# Patient Record
Sex: Female | Born: 1995 | Race: Black or African American | Hispanic: No | Marital: Single | Smoking: Former smoker
Health system: Southern US, Community
[De-identification: ages and names within clinical notes are randomized; demographics above are authoritative.]

## PROBLEM LIST (undated history)

## (undated) DIAGNOSIS — Z889 Allergy status to unspecified drugs, medicaments and biological substances status: Secondary | ICD-10-CM

## (undated) DIAGNOSIS — A549 Gonococcal infection, unspecified: Principal | ICD-10-CM

## (undated) DIAGNOSIS — T7840XA Allergy, unspecified, initial encounter: Secondary | ICD-10-CM

## (undated) DIAGNOSIS — I10 Essential (primary) hypertension: Secondary | ICD-10-CM

## (undated) DIAGNOSIS — J45909 Unspecified asthma, uncomplicated: Secondary | ICD-10-CM

## (undated) HISTORY — DX: Unspecified asthma, uncomplicated: J45.909

## (undated) HISTORY — DX: Gonococcal infection, unspecified: A54.9

## (undated) HISTORY — DX: Allergy status to unspecified drugs, medicaments and biological substances: Z88.9

---
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
## 2014-08-23 ENCOUNTER — Emergency Department (HOSPITAL_COMMUNITY)
Admission: EM | Admit: 2014-08-23 | Discharge: 2014-08-23 | Disposition: A | Discharge status: Home / Self Care | Payer: No Typology Code available for payment source | Attending: Emergency Medicine | Admitting: Emergency Medicine

## 2014-08-23 ENCOUNTER — Encounter (HOSPITAL_COMMUNITY): Payer: Self-pay | Admitting: Emergency Medicine

## 2014-08-23 DIAGNOSIS — S39012A Strain of muscle, fascia and tendon of lower back, initial encounter: Secondary | ICD-10-CM | POA: Insufficient documentation

## 2014-08-23 DIAGNOSIS — Z720 Tobacco use: Secondary | ICD-10-CM | POA: Insufficient documentation

## 2014-08-23 DIAGNOSIS — Y9389 Activity, other specified: Secondary | ICD-10-CM | POA: Diagnosis not present

## 2014-08-23 DIAGNOSIS — Y92410 Unspecified street and highway as the place of occurrence of the external cause: Secondary | ICD-10-CM | POA: Insufficient documentation

## 2014-08-23 DIAGNOSIS — Y998 Other external cause status: Secondary | ICD-10-CM | POA: Diagnosis not present

## 2014-08-23 DIAGNOSIS — Z79899 Other long term (current) drug therapy: Secondary | ICD-10-CM | POA: Insufficient documentation

## 2014-08-23 DIAGNOSIS — S3992XA Unspecified injury of lower back, initial encounter: Secondary | ICD-10-CM | POA: Diagnosis present

## 2014-08-23 DIAGNOSIS — T148XXA Other injury of unspecified body region, initial encounter: Secondary | ICD-10-CM

## 2014-08-23 MED ORDER — ACETAMINOPHEN 325 MG PO TABS
650.0000 mg | ORAL_TABLET | Freq: Once | ORAL | Status: AC
  Administered 2014-08-23: 650 mg via ORAL
  Filled 2014-08-23: qty 2

## 2014-08-23 MED ORDER — IBUPROFEN 600 MG PO TABS: 600.0000 mg | ORAL_TABLET | Freq: Four times a day (QID) | ORAL | Status: DC | PRN

## 2014-08-23 MED ORDER — CYCLOBENZAPRINE HCL 10 MG PO TABS: 10.0000 mg | ORAL_TABLET | Freq: Two times a day (BID) | ORAL | Status: DC | PRN

## 2014-08-23 MED ORDER — KETOROLAC TROMETHAMINE 10 MG PO TABS
10.0000 mg | ORAL_TABLET | Freq: Once | ORAL | Status: AC
  Administered 2014-08-23: 10 mg via ORAL
  Filled 2014-08-23: qty 1

## 2014-08-23 MED ORDER — CYCLOBENZAPRINE HCL 10 MG PO TABS
10.0000 mg | ORAL_TABLET | Freq: Once | ORAL | Status: AC
  Administered 2014-08-23: 10 mg via ORAL
  Filled 2014-08-23: qty 1
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
## 2015-03-23 ENCOUNTER — Emergency Department (HOSPITAL_COMMUNITY)
Admission: EM | Admit: 2015-03-23 | Discharge: 2015-03-23 | Disposition: A | Discharge status: Home / Self Care | Payer: Medicaid Other | Attending: Emergency Medicine | Admitting: Emergency Medicine

## 2015-03-23 ENCOUNTER — Emergency Department (HOSPITAL_COMMUNITY): Payer: Medicaid Other

## 2015-03-23 ENCOUNTER — Encounter (HOSPITAL_COMMUNITY): Payer: Self-pay

## 2015-03-23 DIAGNOSIS — X58XXXA Exposure to other specified factors, initial encounter: Secondary | ICD-10-CM | POA: Diagnosis not present

## 2015-03-23 DIAGNOSIS — S9031XA Contusion of right foot, initial encounter: Secondary | ICD-10-CM | POA: Insufficient documentation

## 2015-03-23 DIAGNOSIS — Y9289 Other specified places as the place of occurrence of the external cause: Secondary | ICD-10-CM | POA: Diagnosis not present

## 2015-03-23 DIAGNOSIS — Y998 Other external cause status: Secondary | ICD-10-CM | POA: Diagnosis not present

## 2015-03-23 DIAGNOSIS — L0231 Cutaneous abscess of buttock: Secondary | ICD-10-CM

## 2015-03-23 DIAGNOSIS — Y9389 Activity, other specified: Secondary | ICD-10-CM | POA: Insufficient documentation

## 2015-03-23 DIAGNOSIS — Z720 Tobacco use: Secondary | ICD-10-CM | POA: Insufficient documentation

## 2015-03-23 MED ORDER — IBUPROFEN 600 MG PO TABS: 600.0000 mg | ORAL_TABLET | Freq: Four times a day (QID) | ORAL | Status: DC | PRN

## 2015-03-23 MED ORDER — SULFAMETHOXAZOLE-TRIMETHOPRIM 800-160 MG PO TABS: 1.0000 | ORAL_TABLET | Freq: Two times a day (BID) | ORAL | Status: AC

## 2015-03-23 MED ORDER — IBUPROFEN 800 MG PO TABS
800.0000 mg | ORAL_TABLET | Freq: Once | ORAL | Status: AC
  Administered 2015-03-23: 800 mg via ORAL
  Filled 2015-03-23: qty 1

## 2015-03-23 MED ORDER — SULFAMETHOXAZOLE-TRIMETHOPRIM 800-160 MG PO TABS
1.0000 | ORAL_TABLET | Freq: Once | ORAL | Status: AC
  Administered 2015-03-23: 1 via ORAL
  Filled 2015-03-23: qty 1

## 2015-03-23 NOTE — ED Notes (Signed)
Pt reports abscess to buttock for past 3 days and knot on r foot.  Pt reports knot on foot has been present for 2 weeks.

## 2015-03-23 NOTE — Discharge Instructions (Signed)
Abscess An abscess is an infected area that contains a collection of pus and debris.It can occur in almost any part of the body. An abscess is also known as a furuncle or boil. CAUSES  An abscess occurs when tissue gets infected. This can occur from blockage of oil or sweat glands, infection of hair follicles, or a minor injury to the skin. As the body tries to fight the infection, pus collects in the area and creates pressure under the skin. This pressure causes pain. People with weakened immune systems have difficulty fighting infections and get certain abscesses more often.  SYMPTOMS Usually an abscess develops on the skin and becomes a painful mass that is red, warm, and tender. If the abscess forms under the skin, you may feel a moveable soft area under the skin. Some abscesses break open (rupture) on their own, but most will continue to get worse without care. The infection can spread deeper into the body and eventually into the bloodstream, causing you to feel ill.  DIAGNOSIS  Your caregiver will take your medical history and perform a physical exam. A sample of fluid may also be taken from the abscess to determine what is causing your infection. TREATMENT  Your caregiver may prescribe antibiotic medicines to fight the infection. However, taking antibiotics alone usually does not cure an abscess. Your caregiver may need to make a small cut (incision) in the abscess to drain the pus. In some cases, gauze is packed into the abscess to reduce pain and to continue draining the area. HOME CARE INSTRUCTIONS   Only take over-the-counter or prescription medicines for pain, discomfort, or fever as directed by your caregiver.  If you were prescribed antibiotics, take them as directed. Finish them even if you start to feel better.  If gauze is used, follow your caregiver's directions for changing the gauze.  To avoid spreading the infection:  Keep your draining abscess covered with a  bandage.  Wash your hands well.  Do not share personal care items, towels, or whirlpools with others.  Avoid skin contact with others.  Keep your skin and clothes clean around the abscess.  Keep all follow-up appointments as directed by your caregiver. SEEK MEDICAL CARE IF:   You have increased pain, swelling, redness, fluid drainage, or bleeding.  You have muscle aches, chills, or a general ill feeling.  You have a fever. MAKE SURE YOU:   Understand these instructions.  Will watch your condition.  Will get help right away if you are not doing well or get worse.   This information is not intended to replace advice given to you by your health care provider. Make sure you discuss any questions you have with your health care provider.   Document Released: 02/22/2005 Document Revised: 11/14/2011 Document Reviewed: 07/28/2011 Elsevier Interactive Patient Education 2016 [place] A contusion is a deep bruise. Contusions are the result of a blunt injury to tissues and muscle fibers under the skin. The injury causes bleeding under the skin. The skin overlying the contusion may turn blue, purple, or yellow. Minor injuries will give you a painless contusion, but more severe contusions may stay painful and swollen for a few weeks.  CAUSES  This condition is usually caused by a blow, trauma, or direct force to an area of the body. SYMPTOMS  Symptoms of this condition include:  Swelling of the injured area.  Pain and tenderness in the injured area.  Discoloration. The area may have redness and then turn  blue, purple, or yellow. DIAGNOSIS  This condition is diagnosed based on a physical exam and medical history. An X-ray, CT scan, or MRI may be needed to determine if there are any associated injuries, such as broken bones (fractures). TREATMENT  Specific treatment for this condition depends on what area of the body was injured. In general, the best treatment for a  contusion is resting, icing, applying pressure to (compression), and elevating the injured area. This is often called the RICE strategy. Over-the-counter anti-inflammatory medicines may also be recommended for pain control.  HOME CARE INSTRUCTIONS   Rest the injured area.  If directed, apply ice to the injured area:  Put ice in a plastic bag.  Place a towel between your skin and the bag.  Leave the ice on for 20 minutes, 2-3 times per day.  If directed, apply light compression to the injured area using an elastic bandage. Make sure the bandage is not wrapped too tightly. Remove and reapply the bandage as directed by your health care provider.  If possible, raise (elevate) the injured area above the level of your heart while you are sitting or lying down.  Take over-the-counter and prescription medicines only as told by your health care provider. SEEK MEDICAL CARE IF:  Your symptoms do not improve after several days of treatment.  Your symptoms get worse.  You have difficulty moving the injured area. SEEK IMMEDIATE MEDICAL CARE IF:   You have severe pain.  You have numbness in a hand or foot.  Your hand or foot turns pale or cold.   This information is not intended to replace advice given to you by your health care provider. Make sure you discuss any questions you have with your health care provider.   Document Released: 02/22/2005 Document Revised: 02/03/2015 Document Reviewed: 09/30/2014 Elsevier Interactive Patient Education [employer].

## 2015-03-25 NOTE — ED Provider Notes (Signed)
CSN: [id number]     Arrival date & time 03/23/15  1715 History   First MD Initiated Contact with Patient 03/23/15 1903     Chief Complaint  Patient presents with  . Abscess     (Consider location/radiation/quality/duration/timing/severity/associated sxs/prior Treatment) The history is provided by the patient.   Christy Goodman is a 19 y.o. female with 2 complaints, first, has an abscess on her right upper buttock present for the past several days.  She has been using warm soaks and endorses that it drained last night and is now smaller but still painful.  She denies history of abscess.  She has not had fevers, chills or rash.  She also notes a swollen tender right lateral foot present for approximately 2 weeks. She has no known trauma to the site.  She works in Scientist, research (medical), on her feet a lot.  Pain is worsened with weight bearing and when she flexes her toes. She had had no medicine or treatment for this condition.     History reviewed. No pertinent past medical history. History reviewed. No pertinent past surgical history. No family history on file. Social History  Substance Use Topics  . Smoking status: Current Some Day Smoker  . Smokeless tobacco: None  . Alcohol Use: Yes     Comment: occ   OB History    No data available     Review of Systems  Constitutional: Negative for fever.  Musculoskeletal: Positive for joint swelling and arthralgias. Negative for myalgias.  Skin:       Negative except as mentioned in HPI.    Neurological: Negative for weakness and numbness.      Allergies  Review of patient's allergies indicates no known allergies.  Home Medications   Prior to Admission medications   Medication Sig Start Date End Date Taking? Authorizing Provider  ibuprofen (ADVIL,MOTRIN) 600 MG tablet Take 1 tablet (600 mg total) by mouth every 6 (six) hours as needed for moderate pain. 03/23/15   Evalee Jefferson, PA-C  sulfamethoxazole-trimethoprim (BACTRIM DS,SEPTRA DS)  800-160 MG tablet Take 1 tablet by mouth 2 (two) times daily. 03/23/15 03/30/15  Evalee Jefferson, PA-C   BP 119/72 mmHg  Pulse 77  Temp(Src) 98.1 F (36.7 C) (Oral)  Resp 16  Ht 5' 2.5" (1.588 m)  Wt 193 lb (87.544 kg)  BMI 34.72 kg/m2  SpO2 100%  LMP 03/20/2015 Physical Exam  Constitutional: She appears well-developed and well-nourished.  HENT:  Head: Atraumatic.  Neck: Normal range of motion.  Cardiovascular:  Pulses equal bilaterally  Musculoskeletal: She exhibits tenderness.       Feet:  Subtle edema and ecchymosis right lateral dorsal midfoot.  Ttp. Pain worsens with toe extension.  Distal sensation intact.  Dorsalis pedal pulse normal. No erythema or red streaking, skin intact.  Neurological: She is alert. She has normal strength. She displays normal reflexes. No sensory deficit.  Skin: Skin is warm and dry.  Small area of induration (1 cm) right upper buttock near midline, draining.  No surrounding erythema.  Psychiatric: She has a normal mood and affect.    ED Course  Procedures (including critical care time) Labs Review Labs Reviewed - No data to display  Imaging Review Dg Foot Complete Right  03/23/2015  CLINICAL DATA:  19 year old female with pain and swelling in the lateral aspect of the right foot for the past several days. Right foot numbness. EXAM: RIGHT FOOT COMPLETE - 3+ VIEW COMPARISON:  No priors. FINDINGS: Multiple views of the right  foot demonstrate no acute displaced fracture, subluxation, dislocation, or soft tissue abnormality. IMPRESSION: No acute radiographic abnormality of the right foot. Electronically Signed   By: Vinnie Langton M.D.   On: 03/23/2015 20:03   I have personally reviewed and evaluated these images and lab results as part of my medical decision-making.   EKG Interpretation None      MDM   Final diagnoses:  Abscess of buttock, left  Foot contusion, right, initial encounter     Radiological studies were viewed, interpreted and  considered during the medical decision making and disposition process. I agree with radiologists reading.  Results were also discussed with patient. Pt with draining small abscess.  Advised warm soaks, added bactrim.  Ibuprofen for pain and foot discomfort.  Suspect Pt may have contused foot without being aware of event.  Exam benign.  Advised f/u with pcp if sx persist or worsen.     Evalee Jefferson, PA-C 03/25/15 1151  Ripley Fraise, MD 03/25/15 [phone]

## 2015-04-07 ENCOUNTER — Encounter: Payer: Medicaid Other | Admitting: Obstetrics and Gynecology

## 2015-04-12 ENCOUNTER — Ambulatory Visit (INDEPENDENT_AMBULATORY_CARE_PROVIDER_SITE_OTHER): Payer: Medicaid Other | Admitting: Obstetrics and Gynecology

## 2015-04-12 ENCOUNTER — Encounter: Payer: Self-pay | Admitting: Obstetrics and Gynecology

## 2015-04-12 VITALS — BP 122/80 | Ht 62.5 in | Wt 198.5 lb

## 2015-04-12 DIAGNOSIS — A6009 Herpesviral infection of other urogenital tract: Secondary | ICD-10-CM | POA: Insufficient documentation

## 2015-04-12 DIAGNOSIS — A609 Anogenital herpesviral infection, unspecified: Secondary | ICD-10-CM

## 2015-04-12 MED ORDER — ACYCLOVIR 400 MG PO TABS: 400.0000 mg | ORAL_TABLET | Freq: Three times a day (TID) | ORAL | Status: DC

## 2015-04-12 NOTE — Progress Notes (Signed)
Patient ID: Christy Goodman, female   DOB: 03/21/1996, 19 y.o.   MRN: [id number] Pt here today for follow up from ER. Pt states that she has something growing on her butt. Pt states that she has had it for about 3 weeks.

## 2015-04-12 NOTE — Progress Notes (Signed)
Patient ID: Tiffanee Tsai Sou, female   DOB: May 22, 1996, 19 y.o.   MRN: [id number]    [facility] Visit  Patient name: Tayleigh Passwater Grigoryan MRN [id number]  Date of birth: 1995/06/22  CC & HPI:  Faina Bowlby Stavola is a 19 y.o. female presenting today for gradually improving, itching lesions on her left buttocks that initially appeared 3 weeks ago. She tried applying A&B ointment and warm soaks with no relief. Pt was seen in the ED on 10/25, was prescribed Bactrim and was diagnosed with an abscess on her buttocks. She did not take the Bactrim because she did not trust the ED provider. Pt denies fever, drainage and lesions in her vaginal area.  ROS:  A complete 10 system review of systems was obtained and all systems are negative except as noted in the HPI and PMH.   Pertinent History Reviewed:   Reviewed. Medical        History reviewed. No pertinent past medical history.                            Surgical Hx:   History reviewed. No pertinent past surgical history. Medications: Reviewed & Updated - see associated section                      No current outpatient prescriptions on file.   Social History: Reviewed -  reports that she has been smoking Cigarettes.  She has a .25 pack-year smoking history. She has never used smokeless tobacco.  Objective Findings:  Vitals: Blood pressure 122/80, height 5' 2.5" (1.588 m), weight 198 lb 8 oz (90.039 kg), last menstrual period 03/20/2015.  Physical Examination: General appearance - alert, well appearing, and in no distress, oriented to person, place, and time and overweight Mental status - alert, oriented to person, place, and time, normal mood, behavior, speech, dress, motor activity, and thought processes Skin - 4 discrete areas of very distinct, desquamated areas on cheek of left buttocks  Assessment & Plan:   A:  1. 4 discrete areas of discrete, desquamated area on left buttocks up to 5 cm diameter.   P:  1. Pt to continue A&B  ointment 2. Pt return in 2 weeks for re-check 3. Will prescribe acyclovir 400 tid x 7d   By signing my name below, I, Tula Nakayama, attest that this documentation has been prepared under the direction and in the presence of Jonnie Kind, MD. Electronically Signed: Tula Nakayama, ED Scribe. 04/12/2015. 12:06 PM.  I personally performed the services described in this documentation, which was SCRIBED in my presence. The recorded information has been reviewed and considered accurate. It has been edited as necessary during review. Jonnie Kind, MD

## 2015-04-26 ENCOUNTER — Ambulatory Visit (INDEPENDENT_AMBULATORY_CARE_PROVIDER_SITE_OTHER): Payer: Medicaid Other | Admitting: Obstetrics and Gynecology

## 2015-04-26 ENCOUNTER — Encounter: Payer: Self-pay | Admitting: Obstetrics and Gynecology

## 2015-04-26 VITALS — BP 120/80 | Ht 62.5 in

## 2015-04-26 DIAGNOSIS — L98419 Non-pressure chronic ulcer of buttock with unspecified severity: Secondary | ICD-10-CM | POA: Diagnosis not present

## 2015-04-26 NOTE — Progress Notes (Signed)
   [facility] Visit  Patient name: Christy Goodman MRN [id number]  Date of birth: 12-17-1995 CC & HPI:  Christy Goodman is a 19 y.o. female presenting today for a follow-up of lesions on her buttocks. Per chart review, patient was seen 14 days ago and treated with acyclovir 400 tid x 7d.  ROS:  A complete review of systems was obtained and all systems are negative except as noted in the HPI and PMH.    Pertinent History Reviewed:   Reviewed: Significant for n/a Medical        History reviewed. No pertinent past medical history.                            Surgical Hx:   History reviewed. No pertinent past surgical history. Medications: Reviewed & Updated - see associated section                      No current outpatient prescriptions on file.   Social History: Reviewed -  reports that she has been smoking Cigarettes.  She has a .25 pack-year smoking history. She has never used smokeless tobacco.  Objective Findings:  Vitals: Blood pressure 120/80, height 5' 2.5" (1.588 m).  Physical Examination: General appearance - alert, well appearing, and in no distress, oriented to person, place, and time and overweight Mental status - alert, oriented to person, place, and time, normal mood, behavior, speech, dress, motor activity, and thought processes, affect appropriate to mood Skin - LESIONS NOTED: 4 discrete areas of very distinct, well-healed, hyperpigmented lesions on cheek of left buttock  Assessment & Plan:   A:  1. Resolved gluteal skin lesions. It is unclear if this represented herpetic lesion  2. Hyperpigmentation at the site of previous ulcerative lesions, stable  P:  1. Pt advised to return if her skin lesions recur for herpes culture 2. F/u prn.   By signing my name below, I, Stephania Fragmin, attest that this documentation has been prepared under the direction and in the presence of Jonnie Kind, MD. Electronically Signed: Stephania Fragmin, ED Scribe. 04/26/2015. 10:00  AM.  I personally performed the services described in this documentation, which was SCRIBED in my presence. The recorded information has been reviewed and considered accurate. It has been edited as necessary during review. Jonnie Kind, MD   (scribe attestation statement)

## 2015-04-26 NOTE — Progress Notes (Signed)
Patient ID: Christy Goodman, female   DOB: 09/14/1995, 19 y.o.   MRN: [id number] Pt here today for follow up visit. Pt states that the area she was seen for last time is much better.

## 2015-04-27 DIAGNOSIS — L98419 Non-pressure chronic ulcer of buttock with unspecified severity: Secondary | ICD-10-CM | POA: Insufficient documentation

## 2015-12-22 ENCOUNTER — Ambulatory Visit: Payer: Medicaid Other | Admitting: Obstetrics and Gynecology

## 2015-12-23 ENCOUNTER — Ambulatory Visit: Payer: Self-pay | Admitting: Advanced Practice Midwife

## 2015-12-29 ENCOUNTER — Ambulatory Visit (INDEPENDENT_AMBULATORY_CARE_PROVIDER_SITE_OTHER): Payer: Self-pay | Admitting: Advanced Practice Midwife

## 2015-12-29 ENCOUNTER — Encounter: Payer: Self-pay | Admitting: Advanced Practice Midwife

## 2015-12-29 VITALS — BP 110/80 | HR 100 | Ht 62.5 in | Wt 198.0 lb

## 2015-12-29 DIAGNOSIS — Z3202 Encounter for pregnancy test, result negative: Secondary | ICD-10-CM

## 2015-12-29 DIAGNOSIS — Z30011 Encounter for initial prescription of contraceptive pills: Secondary | ICD-10-CM

## 2015-12-29 LAB — POCT URINE PREGNANCY: PREG TEST UR: NEGATIVE

## 2015-12-29 MED ORDER — LEVONORGEST-ETH ESTRAD 91-DAY 0.15-0.03 MG PO TABS: 1.0000 | ORAL_TABLET | Freq: Every day | ORAL | 4 refills | Status: DC

## 2015-12-29 NOTE — Progress Notes (Signed)
[facility] Visit  Patient name: Christy Goodman MRN [id number]  Date of birth: 24-Nov-1995  CC & HPI:  Christy Goodman is a 20 y.o. African American female presenting today for birth control. She had been on Nexplanon, gained weight.  She doesn't have insurance, hasn't looked into the Health Dept/Family planning Medicaid.  Wants COCs.  Having unprotected sex  Knows she could get pregnant, but "hopes not.".   Pertinent History Reviewed:  Medical & Surgical Hx:   Past Medical History:  Diagnosis Date  . Asthma   . H/O seasonal allergies    History reviewed. No pertinent surgical history. Family History  Problem Relation Age of Onset  . Cancer Paternal Grandmother   . Diabetes Paternal Grandmother   . Diabetes Paternal Aunt     Current Outpatient Prescriptions:  .  levonorgestrel-ethinyl estradiol (SEASONALE,INTROVALE,JOLESSA) 0.15-0.03 MG tablet, Take 1 tablet by mouth daily., Disp: 1 Package, Rfl: 4 Social History: Reviewed -  reports that she has been smoking Cigarettes.  She has a 0.25 pack-year smoking history. She has never used smokeless tobacco.  Review of Systems:   Constitutional: Negative for fever and chills Eyes: Negative for visual disturbances Respiratory: Negative for shortness of breath, dyspnea Cardiovascular: Negative for chest pain or palpitations  Gastrointestinal: Negative for vomiting, diarrhea and constipation; no abdominal pain Genitourinary: Negative for dysuria and urgency, vaginal irritation or itching Musculoskeletal: Negative for back pain, joint pain, myalgias  Neurological: Negative for dizziness and headaches    Objective Findings:    Physical Examination: General appearance - well appearing, and in no distress Mental status - alert, oriented to person, place, and time Chest:  Normal respiratory effort Heart - normal rate and regular rhythm Musculoskeletal:  Normal range of motion without pain Extremities:  No  edema    Results for orders placed or performed in visit on 12/29/15 (from the past 24 hour(s))  POCT urine pregnancy   Collection Time: 12/29/15  2:52 PM  Result Value Ref Range   Preg Test, Ur Negative Negative      Assessment & Plan:  A:   Samples of [place] (3) given P:  Start with next period.  Condoms encouraged. Also encouraed to look into [facility] medicaid Call me in 3 months and let me know how things are going, sooner if needed   Return if symptoms worsen or fail to improve.  CRESENZO-DISHMAN,Ilyse Tremain CNM 12/29/2015 4:45 PM

## 2016-01-11 ENCOUNTER — Telehealth: Payer: Self-pay | Admitting: Advanced Practice Midwife

## 2016-01-11 NOTE — Telephone Encounter (Signed)
Pt was calling to see if we had samples of her BCP, informed pt we do not keep samples of them here.  Offered to see is Manus Gunning could RX something a little cheaper but pt states her Medicaid has been canceled and she is self pay for everything right now, she is trying to get it fixed but until then she cant afford anything.  Advised pt to make sure and use condoms until she can get on her birthcontrol.

## 2016-02-13 ENCOUNTER — Emergency Department (HOSPITAL_COMMUNITY): Payer: Self-pay

## 2016-02-13 ENCOUNTER — Encounter (HOSPITAL_COMMUNITY): Payer: Self-pay | Admitting: Emergency Medicine

## 2016-02-13 ENCOUNTER — Emergency Department (HOSPITAL_COMMUNITY)
Admission: EM | Admit: 2016-02-13 | Discharge: 2016-02-13 | Disposition: A | Discharge status: Home / Self Care | Payer: Self-pay | Attending: Emergency Medicine | Admitting: Emergency Medicine

## 2016-02-13 DIAGNOSIS — S61219A Laceration without foreign body of unspecified finger without damage to nail, initial encounter: Secondary | ICD-10-CM

## 2016-02-13 DIAGNOSIS — F17210 Nicotine dependence, cigarettes, uncomplicated: Secondary | ICD-10-CM | POA: Insufficient documentation

## 2016-02-13 DIAGNOSIS — J45909 Unspecified asthma, uncomplicated: Secondary | ICD-10-CM | POA: Insufficient documentation

## 2016-02-13 DIAGNOSIS — Z23 Encounter for immunization: Secondary | ICD-10-CM | POA: Insufficient documentation

## 2016-02-13 DIAGNOSIS — Y929 Unspecified place or not applicable: Secondary | ICD-10-CM | POA: Insufficient documentation

## 2016-02-13 DIAGNOSIS — S61214A Laceration without foreign body of right ring finger without damage to nail, initial encounter: Secondary | ICD-10-CM | POA: Insufficient documentation

## 2016-02-13 DIAGNOSIS — Y939 Activity, unspecified: Secondary | ICD-10-CM | POA: Insufficient documentation

## 2016-02-13 DIAGNOSIS — S61216A Laceration without foreign body of right little finger without damage to nail, initial encounter: Secondary | ICD-10-CM | POA: Insufficient documentation

## 2016-02-13 DIAGNOSIS — W268XXA Contact with other sharp object(s), not elsewhere classified, initial encounter: Secondary | ICD-10-CM | POA: Insufficient documentation

## 2016-02-13 DIAGNOSIS — Y999 Unspecified external cause status: Secondary | ICD-10-CM | POA: Insufficient documentation

## 2016-02-13 DIAGNOSIS — S61411A Laceration without foreign body of right hand, initial encounter: Secondary | ICD-10-CM | POA: Insufficient documentation

## 2016-02-13 DIAGNOSIS — S61210A Laceration without foreign body of right index finger without damage to nail, initial encounter: Secondary | ICD-10-CM | POA: Insufficient documentation

## 2016-02-13 DIAGNOSIS — Z79899 Other long term (current) drug therapy: Secondary | ICD-10-CM | POA: Insufficient documentation

## 2016-02-13 MED ORDER — LIDOCAINE-EPINEPHRINE-TETRACAINE (LET) SOLUTION
3.0000 mL | Freq: Once | NASAL | Status: AC
  Administered 2016-02-13: 3 mL via TOPICAL
  Filled 2016-02-13: qty 3

## 2016-02-13 MED ORDER — LIDOCAINE HCL (PF) 2 % IJ SOLN
10.0000 mL | Freq: Once | INTRAMUSCULAR | Status: AC
  Administered 2016-02-13: 10 mL
  Filled 2016-02-13: qty 10

## 2016-02-13 MED ORDER — IBUPROFEN 800 MG PO TABS: 800.0000 mg | ORAL_TABLET | Freq: Three times a day (TID) | ORAL | 0 refills | Status: DC | PRN

## 2016-02-13 MED ORDER — TETANUS-DIPHTH-ACELL PERTUSSIS 5-2.5-18.5 LF-MCG/0.5 IM SUSP
0.5000 mL | Freq: Once | INTRAMUSCULAR | Status: AC
  Administered 2016-02-13: 0.5 mL via INTRAMUSCULAR
  Filled 2016-02-13: qty 0.5

## 2016-02-13 MED ORDER — CEPHALEXIN 500 MG PO CAPS: 500.0000 mg | ORAL_CAPSULE | Freq: Four times a day (QID) | ORAL | 0 refills | Status: DC

## 2016-02-13 NOTE — ED Notes (Signed)
Pt made aware to return if symptoms worsen or if any life threatening symptoms occur.   

## 2016-02-13 NOTE — Discharge Instructions (Signed)
Have your sutures removed in 10 days.  Keep your wound clean and dry,  Until a good scab forms - you may then wash gently twice daily with mild soap and water, but dry completely after.  Get rechecked for any sign of infection (redness,  Swelling,  Increased pain or drainage of purulent fluid). ° °

## 2016-02-13 NOTE — ED Provider Notes (Signed)
[facility] DEPT Provider Note   CSN: [id number] Arrival date & time: 02/13/16  0739     History   Chief Complaint Chief Complaint  Patient presents with  . Laceration    HPI Christy Goodman is a 20 y.o. right handed female presenting with multiple lacerations to her right hand after tripping and landing with [place] type injury, unfortunately landing in broken glass.  She denies hand pain except for localized pain at the site of her wounds. She has applied pressure and dressings but has had no other treatment prior to arrival.  Her tetanus status is unknown.  She denies other injury or pain.  The history is provided by the patient.    Past Medical History:  Diagnosis Date  . Asthma   . H/O seasonal allergies     Patient Active Problem List   Diagnosis Date Noted  . Skin ulcer of buttock ([place]), resolved with  hyperpigmentation 04/27/2015  . Herpes genitalis in women 04/12/2015  . Sore throat 10/02/2013  . Sinusitis, acute 10/02/2013    History reviewed. No pertinent surgical history.  OB History    No data available       Home Medications    Prior to Admission medications   Medication Sig Start Date End Date Taking? Authorizing Provider  levonorgestrel-ethinyl estradiol (SEASONALE,INTROVALE,JOLESSA) 0.15-0.03 MG tablet Take 1 tablet by mouth daily. 12/29/15   Christin Fudge, CNM    Family History Family History  Problem Relation Age of Onset  . Cancer Paternal Grandmother   . Diabetes Paternal Grandmother   . Diabetes Paternal Aunt     Social History Social History  Substance Use Topics  . Smoking status: Current Some Day Smoker    Packs/day: 0.25    Years: 1.00    Types: Cigarettes  . Smokeless tobacco: Never Used  . Alcohol use Yes     Comment: occ     Allergies   Review of patient's allergies indicates no known allergies.   Review of Systems Review of Systems  Constitutional: Negative for chills and fever.  Musculoskeletal:  Negative.   Skin: Positive for wound.  Neurological: Negative for weakness and numbness.     Physical Exam Updated Vital Signs BP 136/92 (BP Location: Left Arm)   Pulse 97   Temp 98.6 F (37 C)   Resp 16   Ht 5\' 3"  (1.6 m)   Wt 89.8 kg   LMP 02/10/2016 (Exact Date)   SpO2 96%   BMI 35.07 kg/m   Physical Exam  Constitutional: She is oriented to person, place, and time. She appears well-developed and well-nourished.  HENT:  Head: Normocephalic.  Cardiovascular: Normal rate.   Pulmonary/Chest: Effort normal.  Musculoskeletal: She exhibits tenderness.  Neurological: She is alert and oriented to person, place, and time. No sensory deficit.  Skin: Laceration noted.  Subcutaneous lacerations on right distal index ring and fifth fingers, approximately three-quarter centimeters each wound.  Hemostatic.  The ring finger laceration is edematous.  There is no purulent drainage.  There is a 2 cm laceration on the right volar hand also subcutaneous and hemostatic.  No palpable or visible foreign body.     ED Treatments / Results  Labs (all labs ordered are listed, but only abnormal results are displayed) Labs Reviewed - No data to display  EKG  EKG Interpretation None       Radiology No results found.  Procedures Procedures (including critical care time)  LACERATION REPAIR Performed by: Evalee Jefferson Authorized by: Delmy Holdren,  Shailen Thielen Consent: Verbal consent obtained. Risks and benefits: risks, benefits and alternatives were discussed Consent given by: patient Patient identity confirmed: provided demographic data Prepped and Draped in normal sterile fashion Wound explored  Laceration Location: right hand, Index fourth and fifth fingers, volar hand  Laceration Length: Three-quarter cm each finger, 2 cm hand laceration  No Foreign Bodies seen or palpated  Anesthesia: Digital blocks using lidocaine 2% without epi on the fingers, 1 cc lidocaine each finger.  Topical let was  used on the hand laceration, required touchup with 2% lidocaine 2 cc.    Local anesthetic: Per above Anesthetic total: 5 ml  Irrigation method: syringe Amount of cleaning: Copious   Skin closure: Ethilon for-0  Number of sutures: 12, including 3 sutures in the fifth, for in the ring finger fingers, 2 in her index finger and 3 on the hand laceration.    Technique: Simple interrupted   Patient tolerance: Patient tolerated the procedure well with no immediate complications.   Medications Ordered in ED Medications  lidocaine (XYLOCAINE) 2 % injection 10 mL (not administered)  lidocaine-EPINEPHrine-tetracaine (LET) solution (not administered)     Initial Impression / Assessment and Plan / ED Course  I have reviewed the triage vital signs and the nursing notes.  Pertinent labs & imaging results that were available during my care of the patient were reviewed by me and considered in my medical decision making (see chart for details).  Clinical Course    Wound care instructions given.  Pt advised to have sutures removed in 10 days,  Return here sooner for any signs of infection including redness, swelling, worse pain or drainage of pus.  Tetanus updated.  She was given Keflex as prophylaxis given these wounds are more than 8 hours old.   Final Clinical Impressions(s) / ED Diagnoses   Final diagnoses:  None    New Prescriptions New Prescriptions   No medications on file     Evalee Jefferson, PA-C 02/13/16 Willis, PA-C 02/13/16 [place], DO 02/16/16 1707

## 2016-02-13 NOTE — ED Triage Notes (Addendum)
Pt tripped and fell onto broken glass causing laceration to palm of right hand appx 3cm long. Pt also has appx 1.5cm laceration to distal end of ring finger on right hand with tissue exposed.  Also has superficial lacerations to distal ends of fifth, third, and second fingers.  No bleeding at this time, edges approximated. PT alert and oriented.  PT denies head injury or loc.

## 2016-02-13 NOTE — ED Notes (Signed)
Non stick pad, guaze and ace wrap applied to right hand, bandaids placed on fingers.

## 2016-02-14 ENCOUNTER — Encounter (HOSPITAL_COMMUNITY): Payer: Self-pay | Admitting: Emergency Medicine

## 2016-02-14 ENCOUNTER — Emergency Department (HOSPITAL_COMMUNITY)
Admission: EM | Admit: 2016-02-14 | Discharge: 2016-02-14 | Disposition: A | Discharge status: Home / Self Care | Payer: Medicaid Other | Attending: Emergency Medicine | Admitting: Emergency Medicine

## 2016-02-14 DIAGNOSIS — Z79899 Other long term (current) drug therapy: Secondary | ICD-10-CM | POA: Insufficient documentation

## 2016-02-14 DIAGNOSIS — J45909 Unspecified asthma, uncomplicated: Secondary | ICD-10-CM | POA: Insufficient documentation

## 2016-02-14 DIAGNOSIS — F17210 Nicotine dependence, cigarettes, uncomplicated: Secondary | ICD-10-CM | POA: Insufficient documentation

## 2016-02-14 DIAGNOSIS — Z4801 Encounter for change or removal of surgical wound dressing: Secondary | ICD-10-CM | POA: Insufficient documentation

## 2016-02-14 DIAGNOSIS — Z791 Long term (current) use of non-steroidal anti-inflammatories (NSAID): Secondary | ICD-10-CM | POA: Insufficient documentation

## 2016-02-14 DIAGNOSIS — Z5189 Encounter for other specified aftercare: Secondary | ICD-10-CM

## 2016-02-14 MED ORDER — TRAMADOL HCL 50 MG PO TABS
50.0000 mg | ORAL_TABLET | Freq: Once | ORAL | Status: AC
  Administered 2016-02-14: 50 mg via ORAL
  Filled 2016-02-14: qty 1

## 2016-02-14 MED ORDER — TRAMADOL HCL 50 MG PO TABS: 50.0000 mg | ORAL_TABLET | Freq: Four times a day (QID) | ORAL | 0 refills | Status: DC | PRN

## 2016-02-14 NOTE — ED Provider Notes (Signed)
[facility] DEPT Provider Note   CSN: [id number] Arrival date & time: 02/14/16  1016  By signing my name below, I, Soijett Blue, attest that this documentation has been prepared under the direction and in the presence of Evalee Jefferson, PA-C Electronically Signed: Soijett Blue, ED Scribe. 02/14/16. 11:20 AM.    History   Chief Complaint Chief Complaint  Patient presents with  . Wound Check    HPI  Christy Goodman is a 20 y.o. female who presents to the Emergency Department complaining of wound check onset today. Pt notes that she was seen in the ED yesterday following a [place] injury landing in glass with laceration repair performed. Pt reports that she is in the ED today due to increased pain to her right ring finger. She states that she is having associated symptoms of right ring finger swelling. She states that she has tried Rx abx and OTC 400 mg ibuprofen without using ice or elevation which was recommended at her initial visit.  She denies drainage, fever, chills, and any other symptoms.   The history is provided by the patient. No language interpreter was used.    Past Medical History:  Diagnosis Date  . Asthma   . H/O seasonal allergies     Patient Active Problem List   Diagnosis Date Noted  . Skin ulcer of buttock ([place]), resolved with  hyperpigmentation 04/27/2015  . Herpes genitalis in women 04/12/2015  . Sore throat 10/02/2013  . Sinusitis, acute 10/02/2013    History reviewed. No pertinent surgical history.  OB History    No data available       Home Medications    Prior to Admission medications   Medication Sig Start Date End Date Taking? Authorizing Provider  cephALEXin (KEFLEX) 500 MG capsule Take 1 capsule (500 mg total) by mouth 4 (four) times daily. 02/13/16  Yes Evalee Jefferson, PA-C  ibuprofen (ADVIL,MOTRIN) 800 MG tablet Take 1 tablet (800 mg total) by mouth every 8 (eight) hours as needed for moderate pain. 02/13/16  Yes Evalee Jefferson, PA-C    levonorgestrel-ethinyl estradiol (SEASONALE,INTROVALE,JOLESSA) 0.15-0.03 MG tablet Take 1 tablet by mouth daily. 12/29/15  Yes Christin Fudge, CNM  traMADol (ULTRAM) 50 MG tablet Take 1 tablet (50 mg total) by mouth every 6 (six) hours as needed. 02/14/16   Evalee Jefferson, PA-C    Family History Family History  Problem Relation Age of Onset  . Cancer Paternal Grandmother   . Diabetes Paternal Grandmother   . Diabetes Paternal Aunt     Social History Social History  Substance Use Topics  . Smoking status: Current Some Day Smoker    Packs/day: 0.25    Years: 1.00    Types: Cigarettes  . Smokeless tobacco: Never Used  . Alcohol use Yes     Comment: occ     Allergies   Review of patient's allergies indicates no known allergies.   Review of Systems Review of Systems  Constitutional: Negative for chills and fever.  Musculoskeletal: Positive for arthralgias (right ring finger) and joint swelling (right ring finger).     Physical Exam Updated Vital Signs BP 139/91 (BP Location: Left Arm)   Pulse 93   Temp 97.8 F (36.6 C) (Oral)   Resp 16   Ht 5\' 3"  (1.6 m)   Wt 89.8 kg   LMP 02/10/2016 (Exact Date)   SpO2 100%   BMI 35.07 kg/m   Physical Exam  Constitutional: She is oriented to person, place, and time. She appears well-developed  and well-nourished. No distress.  HENT:  Head: Normocephalic and atraumatic.  Eyes: EOM are normal.  Neck: Neck supple.  Cardiovascular: Normal rate.   Pulmonary/Chest: Effort normal. No respiratory distress.  Abdominal: She exhibits no distension.  Musculoskeletal: Normal range of motion.  Neurological: She is alert and oriented to person, place, and time.  Skin: Skin is warm and dry. Capillary refill takes less than 2 seconds. Laceration noted.  Lacerations to right hand all appearing to be healing well without sign of infection. Right ring finger wound is still fairly swollen, but wound edges are approximated. No drainage. Distal  sensation intact with less than 2 second cap refill.   Psychiatric: She has a normal mood and affect. Her behavior is normal.  Nursing note and vitals reviewed.    ED Treatments / Results  DIAGNOSTIC STUDIES: Oxygen Saturation is 100% on RA, nl by my interpretation.    COORDINATION OF CARE: 11:16 AM Discussed treatment plan with pt at bedside which includes ice, elevation, continue abx, and pt agreed to plan.    Radiology No results found.  Procedures Procedures (including critical care time)  Medications Ordered in ED Medications  traMADol (ULTRAM) tablet 50 mg (50 mg Oral Given 02/14/16 1124)     Initial Impression / Assessment and Plan / ED Course  I have reviewed the triage vital signs and the nursing notes.  Pertinent imaging results that were available during my care of the patient were reviewed by me and considered in my medical decision making (see chart for details).  Clinical Course    Dressing applied. Pt advised ice and elevation to help with swelling along the edges of the ring finger wound. No sign of infection at this time.  Recheck if sx persist despite this tx.  Finish abx.  Suture removal as initially instructed.  Final Clinical Impressions(s) / ED Diagnoses   Final diagnoses:  Visit for wound check    New Prescriptions Discharge Medication List as of 02/14/2016 11:22 AM    START taking these medications   Details  traMADol (ULTRAM) 50 MG tablet Take 1 tablet (50 mg total) by mouth every 6 (six) hours as needed., Starting Mon 02/14/2016, Print        I personally performed the services described in this documentation, which was scribed in my presence. The recorded information has been reviewed and is accurate.    Evalee Jefferson, PA-C 02/16/16 1616    Julianne Rice, MD 02/21/16 [phone]

## 2016-02-14 NOTE — Discharge Instructions (Signed)
It is important for you to help get the swelling out of your 4th finger, which will help with pain and wound healing.  Ice and elevate as discussed,  apply an ice pack to this wound for 10 minutes every hour while awake and be mindful to keep your hand above the level of your heart as much as possible which will help with swelling too.  Take your ibuprofen prescribed at your last visit 800 mg every 8 hours.  You may take the tramadol prescribed for pain relief.  This will make you drowsy - do not drive within 4 hours of taking this medication.

## 2016-02-14 NOTE — ED Triage Notes (Signed)
Pt here for a re-check of her sutures that were placed on her RT ring finger yesterday. Pt reports increase in pain, but denies any foul odor, change in appearance or discharge.

## 2016-02-14 NOTE — ED Notes (Signed)
Pt c/o continued pain to right hand, primarily right forth finger. Pt has stiches to right palm, index finger, forth finger and fifth finger. No signs of infection noted.

## 2016-04-12 ENCOUNTER — Encounter (HOSPITAL_COMMUNITY): Payer: Self-pay | Admitting: *Deleted

## 2016-04-12 ENCOUNTER — Emergency Department (HOSPITAL_COMMUNITY)
Admission: EM | Admit: 2016-04-12 | Discharge: 2016-04-12 | Disposition: A | Discharge status: Home / Self Care | Payer: Self-pay | Attending: Emergency Medicine | Admitting: Emergency Medicine

## 2016-04-12 DIAGNOSIS — J45909 Unspecified asthma, uncomplicated: Secondary | ICD-10-CM | POA: Insufficient documentation

## 2016-04-12 DIAGNOSIS — B9689 Other specified bacterial agents as the cause of diseases classified elsewhere: Secondary | ICD-10-CM

## 2016-04-12 DIAGNOSIS — F17210 Nicotine dependence, cigarettes, uncomplicated: Secondary | ICD-10-CM | POA: Insufficient documentation

## 2016-04-12 DIAGNOSIS — N946 Dysmenorrhea, unspecified: Secondary | ICD-10-CM | POA: Insufficient documentation

## 2016-04-12 DIAGNOSIS — N760 Acute vaginitis: Secondary | ICD-10-CM | POA: Insufficient documentation

## 2016-04-12 LAB — COMPREHENSIVE METABOLIC PANEL
ALBUMIN: 4 g/dL (ref 3.5–5.0)
ALT: 16 U/L (ref 14–54)
ANION GAP: 6 (ref 5–15)
AST: 17 U/L (ref 15–41)
Alkaline Phosphatase: 52 U/L (ref 38–126)
BILIRUBIN TOTAL: 0.5 mg/dL (ref 0.3–1.2)
BUN: 14 mg/dL (ref 6–20)
CO2: 27 mmol/L (ref 22–32)
Calcium: 9.4 mg/dL (ref 8.9–10.3)
Chloride: 105 mmol/L (ref 101–111)
Creatinine, Ser: 1.03 mg/dL — ABNORMAL HIGH (ref 0.44–1.00)
Glucose, Bld: 106 mg/dL — ABNORMAL HIGH (ref 65–99)
POTASSIUM: 3.8 mmol/L (ref 3.5–5.1)
Sodium: 138 mmol/L (ref 135–145)
TOTAL PROTEIN: 8 g/dL (ref 6.5–8.1)

## 2016-04-12 LAB — CBC WITH DIFFERENTIAL/PLATELET
BASOS PCT: 0 %
Basophils Absolute: 0 10*3/uL (ref 0.0–0.1)
Eosinophils Absolute: 0 10*3/uL (ref 0.0–0.7)
Eosinophils Relative: 0 %
HEMATOCRIT: 38.7 % (ref 36.0–46.0)
Hemoglobin: 12.8 g/dL (ref 12.0–15.0)
LYMPHS ABS: 1.7 10*3/uL (ref 0.7–4.0)
Lymphocytes Relative: 15 %
MCH: 29.9 pg (ref 26.0–34.0)
MCHC: 33.1 g/dL (ref 30.0–36.0)
MCV: 90.4 fL (ref 78.0–100.0)
MONO ABS: 0.5 10*3/uL (ref 0.1–1.0)
MONOS PCT: 4 %
NEUTROS ABS: 9.3 10*3/uL — AB (ref 1.7–7.7)
Neutrophils Relative %: 81 %
Platelets: 331 10*3/uL (ref 150–400)
RBC: 4.28 MIL/uL (ref 3.87–5.11)
RDW: 13.2 % (ref 11.5–15.5)
WBC: 11.5 10*3/uL — ABNORMAL HIGH (ref 4.0–10.5)

## 2016-04-12 LAB — URINE MICROSCOPIC-ADD ON

## 2016-04-12 LAB — WET PREP, GENITAL
Sperm: NONE SEEN
TRICH WET PREP: NONE SEEN
YEAST WET PREP: NONE SEEN

## 2016-04-12 LAB — URINALYSIS, ROUTINE W REFLEX MICROSCOPIC
BILIRUBIN URINE: NEGATIVE
GLUCOSE, UA: NEGATIVE mg/dL
Ketones, ur: NEGATIVE mg/dL
Leukocytes, UA: NEGATIVE
Nitrite: NEGATIVE
PROTEIN: NEGATIVE mg/dL
SPECIFIC GRAVITY, URINE: 1.015 (ref 1.005–1.030)
pH: 6.5 (ref 5.0–8.0)

## 2016-04-12 LAB — I-STAT BETA HCG BLOOD, ED (MC, WL, AP ONLY)

## 2016-04-12 LAB — GC/CHLAMYDIA PROBE AMP (~~LOC~~) NOT AT ARMC
Chlamydia: NEGATIVE
Neisseria Gonorrhea: POSITIVE — AB

## 2016-04-12 MED ORDER — ONDANSETRON 4 MG PO TBDP
4.0000 mg | ORAL_TABLET | Freq: Once | ORAL | Status: AC
  Administered 2016-04-12: 4 mg via ORAL
  Filled 2016-04-12: qty 1

## 2016-04-12 MED ORDER — NAPROXEN 250 MG PO TABS: 250.0000 mg | ORAL_TABLET | Freq: Two times a day (BID) | ORAL | 0 refills | Status: DC | PRN

## 2016-04-12 MED ORDER — METRONIDAZOLE 500 MG PO TABS: 500.0000 mg | ORAL_TABLET | Freq: Two times a day (BID) | ORAL | 0 refills | Status: DC

## 2016-04-12 MED ORDER — KETOROLAC TROMETHAMINE 60 MG/2ML IM SOLN
60.0000 mg | Freq: Once | INTRAMUSCULAR | Status: AC
  Administered 2016-04-12: 60 mg via INTRAMUSCULAR
  Filled 2016-04-12: qty 2

## 2016-04-12 NOTE — ED Triage Notes (Signed)
Pt c/o irregular periods, abd cramping with n/v

## 2016-04-12 NOTE — ED Notes (Signed)
Pelvic exam by EDP

## 2016-04-12 NOTE — Discharge Instructions (Signed)
Take the prescription as directed.  Apply moist heat to the area(s) of discomfort, for 15 minutes at a time, several times per day for the next few days.  Do not fall asleep on a heating pack.  Call your regular OB/GYN doctor today to schedule a follow up appointment this week.  Return to the Emergency Department immediately if worsening. ° °

## 2016-04-12 NOTE — ED Provider Notes (Signed)
[facility] DEPT Provider Note   CSN: [id number] Arrival date & time: 04/12/16  0600     History   Chief Complaint Chief Complaint  Patient presents with  . Vaginal Bleeding    HPI Christy Goodman is a 20 y.o. female.  HPI  Pt was seen at [facility]. Per pt, c/o gradual onset and persistence of constant lower pelvic "cramping" that began last evening. Has been associated with vaginal bleeding and N/V. Pt endorses hx of irregular menses. Denies vaginal discharge, no flank pain, no dysuria/hematuria, no diarrhea, no black or blood in emesis or stools, no fevers.    Past Medical History:  Diagnosis Date  . Asthma   . H/O seasonal allergies     Patient Active Problem List   Diagnosis Date Noted  . Skin ulcer of buttock ([place]), resolved with  hyperpigmentation 04/27/2015  . Herpes genitalis in women 04/12/2015  . Sore throat 10/02/2013  . Sinusitis, acute 10/02/2013    History reviewed. No pertinent surgical history.  OB History    No data available       Home Medications    Prior to Admission medications   Medication Sig Start Date End Date Taking? Authorizing Provider  cephALEXin (KEFLEX) 500 MG capsule Take 1 capsule (500 mg total) by mouth 4 (four) times daily. 02/13/16   Evalee Jefferson, PA-C  ibuprofen (ADVIL,MOTRIN) 800 MG tablet Take 1 tablet (800 mg total) by mouth every 8 (eight) hours as needed for moderate pain. 02/13/16   Evalee Jefferson, PA-C  levonorgestrel-ethinyl estradiol (SEASONALE,INTROVALE,JOLESSA) 0.15-0.03 MG tablet Take 1 tablet by mouth daily. 12/29/15   Christin Fudge, CNM  traMADol (ULTRAM) 50 MG tablet Take 1 tablet (50 mg total) by mouth every 6 (six) hours as needed. 02/14/16   Evalee Jefferson, PA-C    Family History Family History  Problem Relation Age of Onset  . Cancer Paternal Grandmother   . Diabetes Paternal Grandmother   . Diabetes Paternal Aunt     Social History Social History  Substance Use Topics  . Smoking status: Current Some  Day Smoker    Packs/day: 0.25    Years: 1.00    Types: Cigarettes  . Smokeless tobacco: Never Used  . Alcohol use Yes     Comment: occ     Allergies   Patient has no known allergies.   Review of Systems Review of Systems ROS: Statement: All systems negative except as marked or noted in the HPI; Constitutional: Negative for fever and chills. ; ; Eyes: Negative for eye pain, redness and discharge. ; ; ENMT: Negative for ear pain, hoarseness, nasal congestion, sinus pressure and sore throat. ; ; Cardiovascular: Negative for chest pain, palpitations, diaphoresis, dyspnea and peripheral edema. ; ; Respiratory: Negative for cough, wheezing and stridor. ; ; Gastrointestinal: Negative for nausea, vomiting, diarrhea, abdominal pain, blood in stool, hematemesis, jaundice and rectal bleeding. . ; ; Genitourinary: Negative for dysuria, flank pain and hematuria. ; ; GYN:  +pelvic "cramping," +vaginal bleeding, no vaginal discharge, no vulvar pain. ;; Musculoskeletal: Negative for back pain and neck pain. Negative for swelling and trauma.; ; Skin: Negative for pruritus, rash, abrasions, blisters, bruising and skin lesion.; ; Neuro: Negative for headache, lightheadedness and neck stiffness. Negative for weakness, altered level of consciousness, altered mental status, extremity weakness, paresthesias, involuntary movement, seizure and syncope.       Physical Exam Updated Vital Signs BP 150/96   Pulse 84   Temp 98.1 F (36.7 C) (Oral)   Resp 20  LMP 04/05/2016   SpO2 100%   Physical Exam 0730: Physical examination:  Nursing notes reviewed; Vital signs and O2 SAT reviewed;  Constitutional: Well developed, Well nourished, Well hydrated, In no acute distress; Head:  Normocephalic, atraumatic; Eyes: EOMI, PERRL, No scleral icterus; ENMT: Mouth and pharynx normal, Mucous membranes moist; Neck: Supple, Full range of motion, No lymphadenopathy; Cardiovascular: Regular rate and rhythm, No murmur, rub, or  gallop; Respiratory: Breath sounds clear & equal bilaterally, No rales, rhonchi, wheezes.  Speaking full sentences with ease, Normal respiratory effort/excursion; Chest: Nontender, Movement normal; Abdomen: Soft, Nontender, Nondistended, Normal bowel sounds; Genitourinary: No CVA tenderness. Pelvic exam performed with permission of pt and female ED tech assist during exam.  External genitalia w/o lesions. Vaginal vault with thin pink-yellow discharge.  Cervix w/o lesions, not friable, GC/chlam and wet prep obtained and sent to lab.  Bimanual exam w/o CMT, uterine or adnexal tenderness.;;; Extremities: Pulses normal, No tenderness, No edema, No calf edema or asymmetry.; Neuro: AA&Ox3, Major CN grossly intact.  Speech clear. No gross focal motor or sensory deficits in extremities. Climbs on and off stretcher easily by herself. Gait steady.; Skin: Color normal, Warm, Dry.   ED Treatments / Results  Labs (all labs ordered are listed, but only abnormal results are displayed)   EKG  EKG Interpretation None       Radiology   Procedures Procedures (including critical care time)  Medications Ordered in ED Medications  ondansetron (ZOFRAN-ODT) disintegrating tablet 4 mg (4 mg Oral Given 04/12/16 0626)  ketorolac (TORADOL) injection 60 mg (60 mg Intramuscular Given 04/12/16 0823)     Initial Impression / Assessment and Plan / ED Course  I have reviewed the triage vital signs and the nursing notes.  Pertinent labs & imaging results that were available during my care of the patient were reviewed by me and considered in my medical decision making (see chart for details).  MDM Reviewed: previous chart, nursing note and vitals Reviewed previous: labs Interpretation: labs   Results for orders placed or performed during the hospital encounter of 04/12/16  Wet prep, genital  Result Value Ref Range   Yeast Wet Prep HPF POC NONE SEEN NONE SEEN   Trich, Wet Prep NONE SEEN NONE SEEN   Clue Cells  Wet Prep HPF POC PRESENT (A) NONE SEEN   WBC, Wet Prep HPF POC FEW (A) NONE SEEN   Sperm NONE SEEN   CBC with Differential  Result Value Ref Range   WBC 11.5 (H) 4.0 - 10.5 K/uL   RBC 4.28 3.87 - 5.11 MIL/uL   Hemoglobin 12.8 12.0 - 15.0 g/dL   HCT 38.7 36.0 - 46.0 %   MCV 90.4 78.0 - 100.0 fL   MCH 29.9 26.0 - 34.0 pg   MCHC 33.1 30.0 - 36.0 g/dL   RDW 13.2 11.5 - 15.5 %   Platelets 331 150 - 400 K/uL   Neutrophils Relative % 81 %   Neutro Abs 9.3 (H) 1.7 - 7.7 K/uL   Lymphocytes Relative 15 %   Lymphs Abs 1.7 0.7 - 4.0 K/uL   Monocytes Relative 4 %   Monocytes Absolute 0.5 0.1 - 1.0 K/uL   Eosinophils Relative 0 %   Eosinophils Absolute 0.0 0.0 - 0.7 K/uL   Basophils Relative 0 %   Basophils Absolute 0.0 0.0 - 0.1 K/uL  Comprehensive metabolic panel  Result Value Ref Range   Sodium 138 135 - 145 mmol/L   Potassium 3.8 3.5 - 5.1 mmol/L  Chloride 105 101 - 111 mmol/L   CO2 27 22 - 32 mmol/L   Glucose, Bld 106 (H) 65 - 99 mg/dL   BUN 14 6 - 20 mg/dL   Creatinine, Ser 1.03 (H) 0.44 - 1.00 mg/dL   Calcium 9.4 8.9 - 10.3 mg/dL   Total Protein 8.0 6.5 - 8.1 g/dL   Albumin 4.0 3.5 - 5.0 g/dL   AST 17 15 - 41 U/L   ALT 16 14 - 54 U/L   Alkaline Phosphatase 52 38 - 126 U/L   Total Bilirubin 0.5 0.3 - 1.2 mg/dL   GFR calc non Af Amer >60 >60 mL/min   GFR calc Af Amer >60 >60 mL/min   Anion gap 6 5 - 15  Urinalysis, Routine w reflex microscopic  Result Value Ref Range   Color, Urine YELLOW YELLOW   APPearance CLEAR CLEAR   Specific Gravity, Urine 1.015 1.005 - 1.030   pH 6.5 5.0 - 8.0   Glucose, UA NEGATIVE NEGATIVE mg/dL   Hgb urine dipstick MODERATE (A) NEGATIVE   Bilirubin Urine NEGATIVE NEGATIVE   Ketones, ur NEGATIVE NEGATIVE mg/dL   Protein, ur NEGATIVE NEGATIVE mg/dL   Nitrite NEGATIVE NEGATIVE   Leukocytes, UA NEGATIVE NEGATIVE  Urine microscopic-add on  Result Value Ref Range   Squamous Epithelial / LPF 0-5 (A) NONE SEEN   WBC, UA 0-5 0 - 5 WBC/hpf   RBC /  HPF 6-30 0 - 5 RBC/hpf   Bacteria, UA RARE (A) NONE SEEN  I-Stat beta hCG blood, ED  Result Value Ref Range   I-stat hCG, quantitative <5.0 <5 mIU/mL   Comment 3            0830:  Tx for BV, f/u OB/GYN MD. Dx and testing d/w pt.  Questions answered.  Verb understanding, agreeable to d/c home with outpt f/u.     Final Clinical Impressions(s) / ED Diagnoses   Final diagnoses:  None    New Prescriptions New Prescriptions   No medications on file     Francine Graven, DO 04/15/16 2011

## 2016-04-15 ENCOUNTER — Emergency Department (HOSPITAL_COMMUNITY)
Admission: EM | Admit: 2016-04-15 | Discharge: 2016-04-15 | Disposition: A | Discharge status: Home / Self Care | Payer: Self-pay | Attending: Emergency Medicine | Admitting: Emergency Medicine

## 2016-04-15 DIAGNOSIS — Z79899 Other long term (current) drug therapy: Secondary | ICD-10-CM | POA: Insufficient documentation

## 2016-04-15 DIAGNOSIS — F17210 Nicotine dependence, cigarettes, uncomplicated: Secondary | ICD-10-CM | POA: Insufficient documentation

## 2016-04-15 DIAGNOSIS — T50905A Adverse effect of unspecified drugs, medicaments and biological substances, initial encounter: Secondary | ICD-10-CM | POA: Insufficient documentation

## 2016-04-15 DIAGNOSIS — Y69 Unspecified misadventure during surgical and medical care: Secondary | ICD-10-CM | POA: Insufficient documentation

## 2016-04-15 DIAGNOSIS — T887XXA Unspecified adverse effect of drug or medicament, initial encounter: Secondary | ICD-10-CM | POA: Insufficient documentation

## 2016-04-15 DIAGNOSIS — A549 Gonococcal infection, unspecified: Secondary | ICD-10-CM | POA: Insufficient documentation

## 2016-04-15 DIAGNOSIS — R112 Nausea with vomiting, unspecified: Secondary | ICD-10-CM

## 2016-04-15 DIAGNOSIS — J45909 Unspecified asthma, uncomplicated: Secondary | ICD-10-CM | POA: Insufficient documentation

## 2016-04-15 LAB — URINALYSIS, ROUTINE W REFLEX MICROSCOPIC
GLUCOSE, UA: NEGATIVE mg/dL
KETONES UR: NEGATIVE mg/dL
NITRITE: NEGATIVE
PROTEIN: NEGATIVE mg/dL
Specific Gravity, Urine: 1.015 (ref 1.005–1.030)
pH: 7 (ref 5.0–8.0)

## 2016-04-15 LAB — URINE MICROSCOPIC-ADD ON

## 2016-04-15 MED ORDER — PROMETHAZINE HCL 25 MG PO TABS: 25.0000 mg | ORAL_TABLET | Freq: Four times a day (QID) | ORAL | 0 refills | Status: DC | PRN

## 2016-04-15 MED ORDER — IBUPROFEN 600 MG PO TABS: 600.0000 mg | ORAL_TABLET | Freq: Four times a day (QID) | ORAL | 0 refills | Status: DC | PRN

## 2016-04-15 MED ORDER — CEFTRIAXONE SODIUM 250 MG IJ SOLR
250.0000 mg | Freq: Once | INTRAMUSCULAR | Status: AC
  Administered 2016-04-15: 250 mg via INTRAMUSCULAR
  Filled 2016-04-15: qty 250

## 2016-04-15 MED ORDER — IBUPROFEN 800 MG PO TABS
800.0000 mg | ORAL_TABLET | Freq: Once | ORAL | Status: AC
  Administered 2016-04-15: 800 mg via ORAL
  Filled 2016-04-15: qty 1

## 2016-04-15 MED ORDER — PROMETHAZINE HCL 12.5 MG PO TABS
25.0000 mg | ORAL_TABLET | Freq: Once | ORAL | Status: AC
  Administered 2016-04-15: 25 mg via ORAL
  Filled 2016-04-15: qty 2

## 2016-04-15 MED ORDER — LIDOCAINE HCL (PF) 1 % IJ SOLN
5.0000 mL | Freq: Once | INTRAMUSCULAR | Status: AC
  Administered 2016-04-15: 5 mL
  Filled 2016-04-15: qty 5

## 2016-04-15 NOTE — Discharge Instructions (Signed)
As discussed, you have been treated for gonorrhea with todays injection of Rocephin.  I encourage you to complete the flagyl you were prescribed previously,  I have prescribed phenergan to help with nausea and vomiting.  Take this 30 minutes prior to your dose of flagyl.  Your partner will need to be treated for this infection as discussed.  Avoid intercourse for the next week or until all symptoms have resolved.

## 2016-04-15 NOTE — ED Provider Notes (Signed)
[facility] DEPT Provider Note   CSN: [id number] Arrival date & time: 04/15/16  0807     History   Chief Complaint Chief Complaint  Patient presents with  . Abdominal Pain    HPI Christy Goodman is a 20 y.o. female who was seen here 3 days ago for complaints of irregular periods, abdominal cramping and dysuria and diagnosed with bacterial vaginosis presenting with nausea, emesis and continued low pelvic cramping.  She has been unable to keep her flagyl down, but at other times has been able to maintain PO intake.  She reports her vaginal discharge is improving and has had no fevers or chills.  She was last sexually active 1 month ago.  She is unsure if her partner has symptoms.  The history is provided by the patient.    Past Medical History:  Diagnosis Date  . Asthma   . H/O seasonal allergies     Patient Active Problem List   Diagnosis Date Noted  . Skin ulcer of buttock ([place]), resolved with  hyperpigmentation 04/27/2015  . Herpes genitalis in women 04/12/2015  . Sore throat 10/02/2013  . Sinusitis, acute 10/02/2013    No past surgical history on file.  OB History    No data available       Home Medications    Prior to Admission medications   Medication Sig Start Date End Date Taking? Authorizing Provider  cephALEXin (KEFLEX) 500 MG capsule Take 1 capsule (500 mg total) by mouth 4 (four) times daily. 02/13/16   Evalee Jefferson, PA-C  ibuprofen (ADVIL,MOTRIN) 800 MG tablet Take 1 tablet (800 mg total) by mouth every 8 (eight) hours as needed for moderate pain. 02/13/16   Evalee Jefferson, PA-C  levonorgestrel-ethinyl estradiol (SEASONALE,INTROVALE,JOLESSA) 0.15-0.03 MG tablet Take 1 tablet by mouth daily. 12/29/15   Christin Fudge, CNM  metroNIDAZOLE (FLAGYL) 500 MG tablet Take 1 tablet (500 mg total) by mouth 2 (two) times daily. 04/12/16   Francine Graven, DO  naproxen (NAPROSYN) 250 MG tablet Take 1 tablet (250 mg total) by mouth 2 (two) times daily as needed  for mild pain or moderate pain (take with food). 04/12/16   Francine Graven, DO  traMADol (ULTRAM) 50 MG tablet Take 1 tablet (50 mg total) by mouth every 6 (six) hours as needed. 02/14/16   Evalee Jefferson, PA-C    Family History Family History  Problem Relation Age of Onset  . Cancer Paternal Grandmother   . Diabetes Paternal Grandmother   . Diabetes Paternal Aunt     Social History Social History  Substance Use Topics  . Smoking status: Current Some Day Smoker    Packs/day: 0.25    Years: 1.00    Types: Cigarettes  . Smokeless tobacco: Never Used  . Alcohol use Yes     Comment: occ     Allergies   Patient has no known allergies.   Review of Systems Review of Systems  Constitutional: Negative for chills and fever.  HENT: Negative for congestion and sore throat.   Eyes: Negative.   Respiratory: Negative for chest tightness and shortness of breath.   Cardiovascular: Negative for chest pain.  Gastrointestinal: Positive for nausea and vomiting. Negative for abdominal pain.  Genitourinary: Positive for dysuria, pelvic pain and vaginal discharge. Negative for flank pain and hematuria.  Musculoskeletal: Negative for arthralgias, joint swelling and neck pain.  Skin: Negative.  Negative for rash and wound.  Neurological: Negative for dizziness, weakness, light-headedness, numbness and headaches.  Psychiatric/Behavioral: Negative.  Physical Exam Updated Vital Signs BP 123/98 (BP Location: Left Arm)   Pulse 91   Temp 97.9 F (36.6 C) (Oral)   Resp 16   Ht 5\' 3"  (1.6 m)   Wt 89.8 kg   LMP 04/05/2016   SpO2 99%   BMI 35.07 kg/m   Physical Exam  Constitutional: She appears well-developed and well-nourished.  HENT:  Head: Normocephalic and atraumatic.  Eyes: Conjunctivae are normal.  Neck: Normal range of motion.  Cardiovascular: Normal rate, regular rhythm, normal heart sounds and intact distal pulses.   Pulmonary/Chest: Effort normal and breath sounds normal. She  has no wheezes.  Abdominal: Soft. Bowel sounds are normal. There is no tenderness.  Genitourinary:  Genitourinary Comments: Deferred today.  Musculoskeletal: Normal range of motion.  Neurological: She is alert.  Skin: Skin is warm and dry.  Psychiatric: She has a normal mood and affect.  Nursing note and vitals reviewed.    ED Treatments / Results  Labs (all labs ordered are listed, but only abnormal results are displayed) Labs Reviewed  URINALYSIS, ROUTINE W REFLEX MICROSCOPIC (NOT AT [facility])    EKG  EKG Interpretation None       Radiology No results found.  Procedures Procedures (including critical care time)  Medications Ordered in ED Medications  cefTRIAXone (ROCEPHIN) injection 250 mg (250 mg Intramuscular Given 04/15/16 0857)  promethazine (PHENERGAN) tablet 25 mg (25 mg Oral Given 04/15/16 0857)  ibuprofen (ADVIL,MOTRIN) tablet 800 mg (800 mg Oral Given 04/15/16 0857)  lidocaine (PF) (XYLOCAINE) 1 % injection 5 mL (5 mLs Other Given 04/15/16 0857)     Initial Impression / Assessment and Plan / ED Course  I have reviewed the triage vital signs and the nursing notes.  Pertinent labs & imaging results that were available during my care of the patient were reviewed by me and considered in my medical decision making (see chart for details).  Clinical Course     Review of labs from visit 3 days ago, she is positive for gonorrhea, negative for chlamydia.  She was given rocephin IM, phenergan for help with nausea. Has no fevers,  Suspect nausea is SE of flagyl.  She expresses concern about affording a new medicine - the flagyl cost her $20.  No good cost effective alternatives. Will prescribe phenergan to help with this sx.  Advised her partner will need to be treated and to avoid intercourse for the next week or until sx resolved.  VSS, no fevers, doubt PID.  Final Clinical Impressions(s) / ED Diagnoses   Final diagnoses:  Gonorrhea  Medication adverse effect,  initial encounter  Non-intractable vomiting with nausea, unspecified vomiting type    New Prescriptions New Prescriptions   No medications on file     Evalee Jefferson, PA-C 04/15/16 0900    Fredia Sorrow, MD 04/16/16 [phone]

## 2016-04-15 NOTE — ED Triage Notes (Addendum)
Patient complains of lower abdominal pain that started Tuesday. States pain while voiding. States N/V. Patient states unable to keep medication down. NAD.

## 2016-06-06 ENCOUNTER — Telehealth: Payer: Self-pay | Admitting: Adult Health

## 2016-06-06 ENCOUNTER — Encounter: Payer: Self-pay | Admitting: Adult Health

## 2016-06-06 DIAGNOSIS — A549 Gonococcal infection, unspecified: Secondary | ICD-10-CM

## 2016-06-06 HISTORY — DX: Gonococcal infection, unspecified: A54.9

## 2016-06-06 NOTE — Telephone Encounter (Signed)
Was treated for [facility] in November and is having irregular bleeding, needs to make appt, would be self pay, could go to health dept or go apply for family planning medicaid

## 2016-06-06 NOTE — Telephone Encounter (Signed)
Pt called staring that she would like to speak with Anderson Malta, Pt did not state the reason why. Please contact pt

## 2016-07-23 ENCOUNTER — Encounter (HOSPITAL_COMMUNITY): Payer: Self-pay | Admitting: Emergency Medicine

## 2016-07-23 ENCOUNTER — Emergency Department (HOSPITAL_COMMUNITY)
Admission: EM | Admit: 2016-07-23 | Discharge: 2016-07-23 | Disposition: A | Discharge status: Home / Self Care | Payer: Self-pay | Attending: Emergency Medicine | Admitting: Emergency Medicine

## 2016-07-23 DIAGNOSIS — F17210 Nicotine dependence, cigarettes, uncomplicated: Secondary | ICD-10-CM | POA: Insufficient documentation

## 2016-07-23 DIAGNOSIS — J45909 Unspecified asthma, uncomplicated: Secondary | ICD-10-CM | POA: Insufficient documentation

## 2016-07-23 DIAGNOSIS — F10129 Alcohol abuse with intoxication, unspecified: Secondary | ICD-10-CM | POA: Insufficient documentation

## 2016-07-23 DIAGNOSIS — F10120 Alcohol abuse with intoxication, uncomplicated: Secondary | ICD-10-CM

## 2016-07-23 LAB — CBC WITH DIFFERENTIAL/PLATELET
BASOS PCT: 0 %
Basophils Absolute: 0 10*3/uL (ref 0.0–0.1)
EOS ABS: 0 10*3/uL (ref 0.0–0.7)
Eosinophils Relative: 0 %
HCT: 37 % (ref 36.0–46.0)
Hemoglobin: 12.5 g/dL (ref 12.0–15.0)
LYMPHS ABS: 1.6 10*3/uL (ref 0.7–4.0)
Lymphocytes Relative: 15 %
MCH: 30 pg (ref 26.0–34.0)
MCHC: 33.8 g/dL (ref 30.0–36.0)
MCV: 88.7 fL (ref 78.0–100.0)
Monocytes Absolute: 0.5 10*3/uL (ref 0.1–1.0)
Monocytes Relative: 4 %
NEUTROS ABS: 8.9 10*3/uL — AB (ref 1.7–7.7)
NEUTROS PCT: 81 %
Platelets: 299 10*3/uL (ref 150–400)
RBC: 4.17 MIL/uL (ref 3.87–5.11)
RDW: 14.1 % (ref 11.5–15.5)
WBC: 11 10*3/uL — AB (ref 4.0–10.5)

## 2016-07-23 LAB — URINALYSIS, ROUTINE W REFLEX MICROSCOPIC
BILIRUBIN URINE: NEGATIVE
Bacteria, UA: NONE SEEN
Glucose, UA: NEGATIVE mg/dL
KETONES UR: NEGATIVE mg/dL
Leukocytes, UA: NEGATIVE
NITRITE: NEGATIVE
PROTEIN: NEGATIVE mg/dL
Specific Gravity, Urine: 1.021 (ref 1.005–1.030)
pH: 7 (ref 5.0–8.0)

## 2016-07-23 LAB — COMPREHENSIVE METABOLIC PANEL
ALT: 14 U/L (ref 14–54)
ANION GAP: 8 (ref 5–15)
AST: 17 U/L (ref 15–41)
Albumin: 3.8 g/dL (ref 3.5–5.0)
Alkaline Phosphatase: 44 U/L (ref 38–126)
BUN: 12 mg/dL (ref 6–20)
CALCIUM: 8.7 mg/dL — AB (ref 8.9–10.3)
CHLORIDE: 107 mmol/L (ref 101–111)
CO2: 24 mmol/L (ref 22–32)
CREATININE: 0.75 mg/dL (ref 0.44–1.00)
Glucose, Bld: 96 mg/dL (ref 65–99)
Potassium: 4.1 mmol/L (ref 3.5–5.1)
Sodium: 139 mmol/L (ref 135–145)
Total Bilirubin: 0.4 mg/dL (ref 0.3–1.2)
Total Protein: 7 g/dL (ref 6.5–8.1)

## 2016-07-23 LAB — POC URINE PREG, ED
PREG TEST UR: NEGATIVE
PREG TEST UR: NEGATIVE

## 2016-07-23 LAB — LIPASE, BLOOD: Lipase: 19 U/L (ref 11–51)

## 2016-07-23 MED ORDER — PROMETHAZINE HCL 25 MG PO TABS: 25.0000 mg | ORAL_TABLET | Freq: Four times a day (QID) | ORAL | 0 refills | Status: DC | PRN

## 2016-07-23 MED ORDER — SODIUM CHLORIDE 0.9 % IV BOLUS (SEPSIS)
1000.0000 mL | Freq: Once | INTRAVENOUS | Status: AC
  Administered 2016-07-23: 1000 mL via INTRAVENOUS

## 2016-07-23 MED ORDER — ONDANSETRON HCL 4 MG/2ML IJ SOLN
4.0000 mg | Freq: Once | INTRAMUSCULAR | Status: AC
  Administered 2016-07-23: 4 mg via INTRAVENOUS
  Filled 2016-07-23: qty 2

## 2016-07-23 NOTE — Discharge Instructions (Signed)
As discussed, I suspect your symptoms are directly related to the alcohol you drank last night.  Use caution with alcohol consumption, drinking in moderation (not to get intoxicated) or not at all is the healthiest life style choice for you.

## 2016-07-23 NOTE — ED Triage Notes (Signed)
Patient c/o nausea and vomiting that started last night. Patient denies any abd pain, diarrhea, or urinary symptoms. Per patient yellow-green emesis and dry heaving. Patient unsure of the amount of times she has vomited in past 24 hours.

## 2016-07-23 NOTE — ED Notes (Signed)
Ginger-ale given to pt for PO challange

## 2016-07-23 NOTE — ED Provider Notes (Signed)
[facility] DEPT Provider Note   CSN: [id number] Arrival date & time: 07/23/16  0857     History   Chief Complaint Chief Complaint  Patient presents with  . Emesis    HPI Christy Goodman is a 21 y.o. female with past medical hx as outlined below presenting with nausea and emesis  Which started yesterday evening after eating [place] food (others ate too without sx) and proceeded to drink 4 shots of alcohol over about an hours time.  She woke several hours later vomiting.  She states she has drank this much before without sx, so is unsure if this is "hangover" vomiting.  She denies hematemesis, abdominal pain, she feels "empty" in her epigastric area.  Denies diarrhea, dysuria, fevers or chills. No tx prior to arrival.    The history is provided by the patient.    Past Medical History:  Diagnosis Date  . Asthma   . Gonorrhea 06/06/2016  . H/O seasonal allergies     Patient Active Problem List   Diagnosis Date Noted  . Gonorrhea 06/06/2016  . Skin ulcer of buttock ([place]), resolved with  hyperpigmentation 04/27/2015  . Herpes genitalis in women 04/12/2015  . Sore throat 10/02/2013  . Sinusitis, acute 10/02/2013    No past surgical history on file.  OB History    Gravida Para Term Preterm AB Living   0 0 0 0 0 0   SAB TAB Ectopic Multiple Live Births   0 0 0 0 0       Home Medications    Prior to Admission medications   Medication Sig Start Date End Date Taking? Authorizing Provider  cephALEXin (KEFLEX) 500 MG capsule Take 1 capsule (500 mg total) by mouth 4 (four) times daily. Patient not taking: Reported on 07/23/2016 02/13/16   Evalee Jefferson, PA-C  levonorgestrel-ethinyl estradiol (SEASONALE,INTROVALE,JOLESSA) 0.15-0.03 MG tablet Take 1 tablet by mouth daily. Patient not taking: Reported on 07/23/2016 12/29/15   Christin Fudge, CNM  promethazine (PHENERGAN) 25 MG tablet Take 1 tablet (25 mg total) by mouth every 6 (six) hours as needed for nausea or vomiting.  07/23/16   Evalee Jefferson, PA-C    Family History Family History  Problem Relation Age of Onset  . Cancer Paternal Grandmother   . Diabetes Paternal Grandmother   . Diabetes Paternal Aunt     Social History Social History  Substance Use Topics  . Smoking status: Current Some Day Smoker    Packs/day: 0.25    Years: 1.00    Types: Cigarettes  . Smokeless tobacco: Never Used  . Alcohol use Yes     Comment: occ     Allergies   Patient has no known allergies.   Review of Systems Review of Systems  Constitutional: Negative for fever.  HENT: Negative for congestion and sore throat.   Eyes: Negative.   Respiratory: Negative for chest tightness and shortness of breath.   Cardiovascular: Negative for chest pain.  Gastrointestinal: Positive for nausea and vomiting. Negative for abdominal pain.  Genitourinary: Negative.   Musculoskeletal: Negative for arthralgias, joint swelling and neck pain.  Skin: Negative.  Negative for rash and wound.  Neurological: Negative for dizziness, weakness, light-headedness, numbness and headaches.  Psychiatric/Behavioral: Negative.      Physical Exam Updated Vital Signs BP 136/88 (BP Location: Left Arm)   Pulse 90   Temp 97.4 F (36.3 C) (Oral)   Resp 18   Ht 5\' 3"  (1.6 m)   Wt 89.8 kg   LMP  07/09/2016   SpO2 100%   BMI 35.07 kg/m   Physical Exam  Constitutional: She appears well-developed and well-nourished.  HENT:  Head: Normocephalic and atraumatic.  Eyes: Conjunctivae are normal.  Neck: Normal range of motion.  Cardiovascular: Normal rate, regular rhythm, normal heart sounds and intact distal pulses.   Pulmonary/Chest: Effort normal and breath sounds normal. She has no wheezes.  Abdominal: Soft. Bowel sounds are normal. She exhibits no distension and no mass. There is no tenderness. There is no guarding.  Musculoskeletal: Normal range of motion.  Neurological: She is alert.  Skin: Skin is warm and dry.  Psychiatric: She has a  normal mood and affect.  Nursing note and vitals reviewed.    ED Treatments / Results  Labs (all labs ordered are listed, but only abnormal results are displayed) Labs Reviewed  COMPREHENSIVE METABOLIC PANEL - Abnormal; Notable for the following:       Result Value   Calcium 8.7 (*)    All other components within normal limits  CBC WITH DIFFERENTIAL/PLATELET - Abnormal; Notable for the following:    WBC 11.0 (*)    Neutro Abs 8.9 (*)    All other components within normal limits  URINALYSIS, ROUTINE W REFLEX MICROSCOPIC - Abnormal; Notable for the following:    Hgb urine dipstick MODERATE (*)    All other components within normal limits  LIPASE, BLOOD  POC URINE PREG, ED  POC URINE PREG, ED    EKG  EKG Interpretation None       Radiology No results found.  Procedures Procedures (including critical care time)  Medications Ordered in ED Medications  sodium chloride 0.9 % bolus 1,000 mL (1,000 mLs Intravenous New Bag/Given 07/23/16 0946)  ondansetron (ZOFRAN) injection 4 mg (4 mg Intravenous Given 07/23/16 0946)     Initial Impression / Assessment and Plan / ED Course  I have reviewed the triage vital signs and the nursing notes.  Pertinent labs & imaging results that were available during my care of the patient were reviewed by me and considered in my medical decision making (see chart for details).     Pt given IV fluids, zofran, tolerated PO intake and felt much improved at time of dispo.  Labs stable,  Suspect vomiting from directly related to etoh consumption.  Abd soft, nontender, benign at re-exam.   The patient appears reasonably screened and/or stabilized for discharge and I doubt any other medical condition or other [facility] requiring further screening, evaluation, or treatment in the ED at this time prior to discharge.   Final Clinical Impressions(s) / ED Diagnoses   Final diagnoses:  Hangover effect, uncomplicated (HCC)    New Prescriptions New  Prescriptions   PROMETHAZINE (PHENERGAN) 25 MG TABLET    Take 1 tablet (25 mg total) by mouth every 6 (six) hours as needed for nausea or vomiting.     Evalee Jefferson, PA-C 07/23/16 1100    Milton Ferguson, MD 07/24/16 [phone]

## 2016-12-25 ENCOUNTER — Emergency Department (HOSPITAL_COMMUNITY)
Admission: EM | Admit: 2016-12-25 | Discharge: 2016-12-25 | Disposition: A | Discharge status: Home / Self Care | Payer: Self-pay | Attending: Emergency Medicine | Admitting: Emergency Medicine

## 2016-12-25 ENCOUNTER — Encounter (HOSPITAL_COMMUNITY): Payer: Self-pay | Admitting: Emergency Medicine

## 2016-12-25 DIAGNOSIS — T7840XA Allergy, unspecified, initial encounter: Secondary | ICD-10-CM | POA: Insufficient documentation

## 2016-12-25 DIAGNOSIS — F17210 Nicotine dependence, cigarettes, uncomplicated: Secondary | ICD-10-CM | POA: Insufficient documentation

## 2016-12-25 DIAGNOSIS — J45909 Unspecified asthma, uncomplicated: Secondary | ICD-10-CM | POA: Insufficient documentation

## 2016-12-25 DIAGNOSIS — R21 Rash and other nonspecific skin eruption: Secondary | ICD-10-CM | POA: Insufficient documentation

## 2016-12-25 MED ORDER — DIPHENHYDRAMINE HCL 25 MG PO CAPS: 25.0000 mg | ORAL_CAPSULE | Freq: Four times a day (QID) | ORAL | 0 refills | Status: DC | PRN

## 2016-12-25 MED ORDER — DIPHENHYDRAMINE HCL 25 MG PO CAPS
25.0000 mg | ORAL_CAPSULE | Freq: Once | ORAL | Status: AC
  Administered 2016-12-25: 25 mg via ORAL
  Filled 2016-12-25: qty 1

## 2016-12-25 NOTE — Discharge Instructions (Signed)
Please read attached information regarding your condition. Take Benadryl or other antihistamine as needed for itching. Follow-up with PCP for further evaluation and allergy testing. Return to ED for worsening rash, lip swelling, trouble breathing, chest pain, signs of severe allergic reaction.

## 2016-12-25 NOTE — ED Triage Notes (Signed)
Patient states she slept over at a friends house last night and woke up complaining of generalized bumps that were itching.

## 2016-12-25 NOTE — ED Provider Notes (Signed)
[facility] DEPT Provider Note   CSN: [id number] Arrival date & time: 12/25/16  0724     History   Chief Complaint Chief Complaint  Patient presents with  . Rash    HPI Christy Goodman is a 21 y.o. female.  HPI Patient presents to ED for evaluation of rash that began about 7 hours ago. She reports small itchy bumps on her arms, face and chest. She states that most of the bumps have resolved she has had similar bumps for the past few months. She is unsure if she is allergic to anything but denies any new lotions, soaps, detergents or fabric's used. She states that last night she was staying at a friend's house and sleeping on her couch when she began scratching. She has not tried any medications prior to arrival. She denies any lip swelling, trouble breathing, trouble swallowing, chest pain or signs of severe allergic reaction.  Past Medical History:  Diagnosis Date  . Asthma   . Gonorrhea 06/06/2016  . H/O seasonal allergies     Patient Active Problem List   Diagnosis Date Noted  . Gonorrhea 06/06/2016  . Skin ulcer of buttock ([place]), resolved with  hyperpigmentation 04/27/2015  . Herpes genitalis in women 04/12/2015  . Sore throat 10/02/2013  . Sinusitis, acute 10/02/2013    History reviewed. No pertinent surgical history.  OB History    Gravida Para Term Preterm AB Living   0 0 0 0 0 0   SAB TAB Ectopic Multiple Live Births   0 0 0 0 0       Home Medications    Prior to Admission medications   Medication Sig Start Date End Date Taking? Authorizing Provider  cephALEXin (KEFLEX) 500 MG capsule Take 1 capsule (500 mg total) by mouth 4 (four) times daily. Patient not taking: Reported on 07/23/2016 02/13/16   Evalee Jefferson, PA-C  diphenhydrAMINE (BENADRYL) 25 mg capsule Take 1 capsule (25 mg total) by mouth every 6 (six) hours as needed. 12/25/16   Icess Bertoni, PA-C  levonorgestrel-ethinyl estradiol (SEASONALE,INTROVALE,JOLESSA) 0.15-0.03 MG tablet Take 1 tablet by  mouth daily. Patient not taking: Reported on 07/23/2016 12/29/15   Cresenzo-Dishmon, Joaquim Lai, CNM  promethazine (PHENERGAN) 25 MG tablet Take 1 tablet (25 mg total) by mouth every 6 (six) hours as needed for nausea or vomiting. 07/23/16   Evalee Jefferson, PA-C    Family History Family History  Problem Relation Age of Onset  . Cancer Paternal Grandmother   . Diabetes Paternal Grandmother   . Diabetes Paternal Aunt     Social History Social History  Substance Use Topics  . Smoking status: Current Some Day Smoker    Packs/day: 0.25    Years: 1.00    Types: Cigarettes  . Smokeless tobacco: Never Used  . Alcohol use Yes     Comment: occ     Allergies   Patient has no known allergies.   Review of Systems Review of Systems  Constitutional: Negative for chills and fever.  HENT: Negative for facial swelling.   Respiratory: Negative for choking.   Cardiovascular: Negative for chest pain.  Gastrointestinal: Negative for nausea and vomiting.  Skin: Positive for rash.     Physical Exam Updated Vital Signs BP 132/89 (BP Location: Right Arm)   Pulse 71   Temp 97.9 F (36.6 C) (Oral)   Resp 16   Ht 5\' 2"  (1.575 m)   Wt 89.8 kg (198 lb)   LMP 12/11/2016   SpO2 100%  BMI 36.21 kg/m   Physical Exam  Constitutional: She appears well-developed and well-nourished. No distress.  HENT:  Head: Normocephalic and atraumatic.  Eyes: Conjunctivae and EOM are normal. No scleral icterus.  Neck: Normal range of motion.  Pulmonary/Chest: Effort normal. No respiratory distress.  Neurological: She is alert.  Skin: Rash noted. She is not diaphoretic.  Small bumps noticed on right upper arm. Patient states that bumps on other parts of body have now resolved. No blistering, excoriation, drainage or bleeding noted from sites. No lesions present between digits.  Psychiatric: She has a normal mood and affect.  Nursing note and vitals reviewed.    ED Treatments / Results  Labs (all labs  ordered are listed, but only abnormal results are displayed) Labs Reviewed - No data to display  EKG  EKG Interpretation None       Radiology No results found.  Procedures Procedures (including critical care time)  Medications Ordered in ED Medications  diphenhydrAMINE (BENADRYL) capsule 25 mg (not administered)     Initial Impression / Assessment and Plan / ED Course  I have reviewed the triage vital signs and the nursing notes.  Pertinent labs & imaging results that were available during my care of the patient were reviewed by me and considered in my medical decision making (see chart for details).     Patient presents to ED for evaluation of rash that began approximately 8 hours ago. She reports intermittent similar rash for the past few months. She has not tried any medications prior to arrival. Physical exam there are several small bumps on the right forearm and other sites of hyperpigmentation from previously pruritic lesions. Pt has a patent airway without stridor and is handling secretions without difficulty; no angioedema. No blisters, no pustules, no warmth, no draining sinus tracts, no superficial abscesses, no bullous impetigo, no vesicles, no desquamation, no target lesions with dusky purpura or a central bulla. Not tender to touch. No concern for superimposed infection. No concern for SJS, TEN, TSS, tick borne illness, syphilis or other life-threatening condition.  We will give Benadryl for symptomatic relief of itching and advise patient to continue Benadryl or other antihistamine and follow up with PCP for further allergy testing as needed. Patient appears stable for discharge at this time given 1 dose of Benadryl. She'll return precautions given for severe or worsening symptoms.  Final Clinical Impressions(s) / ED Diagnoses   Final diagnoses:  Rash  Allergic reaction, initial encounter    New Prescriptions New Prescriptions   DIPHENHYDRAMINE (BENADRYL) 25 MG  CAPSULE    Take 1 capsule (25 mg total) by mouth every 6 (six) hours as needed.     Delia Heady, PA-C 12/25/16 0240    Davonna Belling, MD 12/25/16 [phone]

## 2017-06-25 ENCOUNTER — Emergency Department (HOSPITAL_COMMUNITY): Payer: Self-pay

## 2017-06-25 ENCOUNTER — Other Ambulatory Visit: Payer: Self-pay

## 2017-06-25 ENCOUNTER — Emergency Department (HOSPITAL_COMMUNITY)
Admission: EM | Admit: 2017-06-25 | Discharge: 2017-06-25 | Disposition: A | Discharge status: Home / Self Care | Payer: Self-pay | Attending: Emergency Medicine | Admitting: Emergency Medicine

## 2017-06-25 ENCOUNTER — Encounter (HOSPITAL_COMMUNITY): Payer: Self-pay | Admitting: Emergency Medicine

## 2017-06-25 DIAGNOSIS — R112 Nausea with vomiting, unspecified: Secondary | ICD-10-CM | POA: Insufficient documentation

## 2017-06-25 DIAGNOSIS — N938 Other specified abnormal uterine and vaginal bleeding: Secondary | ICD-10-CM | POA: Insufficient documentation

## 2017-06-25 DIAGNOSIS — N83202 Unspecified ovarian cyst, left side: Secondary | ICD-10-CM

## 2017-06-25 DIAGNOSIS — J45909 Unspecified asthma, uncomplicated: Secondary | ICD-10-CM | POA: Insufficient documentation

## 2017-06-25 DIAGNOSIS — D270 Benign neoplasm of right ovary: Secondary | ICD-10-CM

## 2017-06-25 DIAGNOSIS — R102 Pelvic and perineal pain: Secondary | ICD-10-CM | POA: Insufficient documentation

## 2017-06-25 DIAGNOSIS — F17210 Nicotine dependence, cigarettes, uncomplicated: Secondary | ICD-10-CM | POA: Insufficient documentation

## 2017-06-25 DIAGNOSIS — Z79899 Other long term (current) drug therapy: Secondary | ICD-10-CM | POA: Insufficient documentation

## 2017-06-25 LAB — WET PREP, GENITAL
SPERM: NONE SEEN
Trich, Wet Prep: NONE SEEN
Yeast Wet Prep HPF POC: NONE SEEN

## 2017-06-25 LAB — URINALYSIS, ROUTINE W REFLEX MICROSCOPIC
Bacteria, UA: NONE SEEN
Bilirubin Urine: NEGATIVE
GLUCOSE, UA: NEGATIVE mg/dL
KETONES UR: NEGATIVE mg/dL
LEUKOCYTES UA: NEGATIVE
NITRITE: NEGATIVE
PH: 7 (ref 5.0–8.0)
Protein, ur: NEGATIVE mg/dL
Specific Gravity, Urine: 1.018 (ref 1.005–1.030)

## 2017-06-25 LAB — BASIC METABOLIC PANEL
Anion gap: 8 (ref 5–15)
BUN: 11 mg/dL (ref 6–20)
CHLORIDE: 106 mmol/L (ref 101–111)
CO2: 25 mmol/L (ref 22–32)
Calcium: 8.9 mg/dL (ref 8.9–10.3)
Creatinine, Ser: 0.84 mg/dL (ref 0.44–1.00)
GFR calc Af Amer: >60 mL/min (ref >60)
GFR calc non Af Amer: >60 mL/min (ref >60)
GLUCOSE: 98 mg/dL (ref 65–99)
POTASSIUM: 3.8 mmol/L (ref 3.5–5.1)
Sodium: 139 mmol/L (ref 135–145)

## 2017-06-25 LAB — CBC
HEMATOCRIT: 39.5 % (ref 36.0–46.0)
HEMOGLOBIN: 12.8 g/dL (ref 12.0–15.0)
MCH: 29.6 pg (ref 26.0–34.0)
MCHC: 32.4 g/dL (ref 30.0–36.0)
MCV: 91.4 fL (ref 78.0–100.0)
Platelets: 282 10*3/uL (ref 150–400)
RBC: 4.32 MIL/uL (ref 3.87–5.11)
RDW: 13.9 % (ref 11.5–15.5)
WBC: 11 10*3/uL — ABNORMAL HIGH (ref 4.0–10.5)

## 2017-06-25 LAB — PREGNANCY, URINE: Preg Test, Ur: NEGATIVE

## 2017-06-25 MED ORDER — MORPHINE SULFATE (PF) 4 MG/ML IV SOLN
4.0000 mg | Freq: Once | INTRAVENOUS | Status: AC
  Administered 2017-06-25: 4 mg via INTRAVENOUS
  Filled 2017-06-25: qty 1

## 2017-06-25 MED ORDER — TRAMADOL HCL 50 MG PO TABS: 50.0000 mg | ORAL_TABLET | Freq: Four times a day (QID) | ORAL | 0 refills | Status: DC | PRN

## 2017-06-25 MED ORDER — ONDANSETRON HCL 4 MG/2ML IJ SOLN
4.0000 mg | Freq: Once | INTRAMUSCULAR | Status: AC
  Administered 2017-06-25: 4 mg via INTRAVENOUS
  Filled 2017-06-25: qty 2

## 2017-06-25 MED ORDER — ONDANSETRON HCL 4 MG PO TABS: 4.0000 mg | ORAL_TABLET | Freq: Three times a day (TID) | ORAL | 0 refills | Status: DC | PRN

## 2017-06-25 MED ORDER — KETOROLAC TROMETHAMINE 30 MG/ML IJ SOLN
30.0000 mg | Freq: Once | INTRAMUSCULAR | Status: AC
  Administered 2017-06-25: 30 mg via INTRAVENOUS
  Filled 2017-06-25: qty 1

## 2017-06-25 NOTE — ED Provider Notes (Signed)
[facility] EMERGENCY DEPARTMENT Provider Note   CSN: [id number] Arrival date & time: 06/25/17  7416     History   Chief Complaint Chief Complaint  Patient presents with  . Abdominal Pain    HPI Christy Goodman is a 22 y.o. female.  HPI Christy Goodman is a 22 y.o. female presents to emergency department complaining of abdominal pain and flank pain.  Patient states her pain initially began at 2 AM, woke her up from sleep.  She states pain was sharp, severe, started in the flank and radiated down into the left groin.  She states she was unable to go back to sleep due to pain and states was crying.  She states she felt like she needed to use the bathroom, and reports having some diarrhea at that time.  She states that she also has had associated nausea and threw up x2.  She states after she threw up she felt slightly better, and was able to go back to sleep.  She reports that when she woke up this morning, her pain has returned and reports several more episodes of emesis.  Denies any more diarrhea.  Denies any urinary symptoms.  No fever or chills.  Denies any vaginal discharge, she is currently on menstrual cycle, day 4.  She denies being pregnant, however she does not take birth control and has had unprotected intercourse recently.  She states any movement is making pain worse, nothing makes it better.  She has not taking any medications prior to coming in because she did not think she could keep it down.  She denies history of similar symptoms in the past.    Past Medical History:  Diagnosis Date  . Asthma   . Gonorrhea 06/06/2016  . H/O seasonal allergies     Patient Active Problem List   Diagnosis Date Noted  . Gonorrhea 06/06/2016  . Skin ulcer of buttock ([place]), resolved with  hyperpigmentation 04/27/2015  . Herpes genitalis in women 04/12/2015  . Sore throat 10/02/2013  . Sinusitis, acute 10/02/2013    History reviewed. No pertinent surgical history.  OB History    Gravida Para Term Preterm AB Living   0 0 0 0 0 0   SAB TAB Ectopic Multiple Live Births   0 0 0 0 0       Home Medications    Prior to Admission medications   Medication Sig Start Date End Date Taking? Authorizing Provider  cephALEXin (KEFLEX) 500 MG capsule Take 1 capsule (500 mg total) by mouth 4 (four) times daily. Patient not taking: Reported on 07/23/2016 02/13/16   Evalee Jefferson, PA-C  diphenhydrAMINE (BENADRYL) 25 mg capsule Take 1 capsule (25 mg total) by mouth every 6 (six) hours as needed. 12/25/16   Khatri, Hina, PA-C  levonorgestrel-ethinyl estradiol (SEASONALE,INTROVALE,JOLESSA) 0.15-0.03 MG tablet Take 1 tablet by mouth daily. Patient not taking: Reported on 07/23/2016 12/29/15   Cresenzo-Dishmon, Joaquim Lai, CNM  promethazine (PHENERGAN) 25 MG tablet Take 1 tablet (25 mg total) by mouth every 6 (six) hours as needed for nausea or vomiting. 07/23/16   Evalee Jefferson, PA-C    Family History Family History  Problem Relation Age of Onset  . Cancer Paternal Grandmother   . Diabetes Paternal Grandmother   . Diabetes Paternal Aunt     Social History Social History   Tobacco Use  . Smoking status: Current Some Day Smoker    Packs/day: 0.25    Years: 1.00    Pack years: 0.25  Types: Cigarettes  . Smokeless tobacco: Never Used  Substance Use Topics  . Alcohol use: Yes    Comment: occ  . Drug use: No     Allergies   Patient has no known allergies.   Review of Systems Review of Systems  Constitutional: Negative for chills and fever.  Respiratory: Negative for cough, chest tightness and shortness of breath.   Cardiovascular: Negative for chest pain, palpitations and leg swelling.  Gastrointestinal: Positive for abdominal pain, diarrhea, nausea and vomiting.  Genitourinary: Positive for flank pain, pelvic pain and vaginal bleeding. Negative for difficulty urinating, dysuria, frequency, hematuria, urgency, vaginal discharge and vaginal pain.  Musculoskeletal: Negative for  arthralgias, myalgias, neck pain and neck stiffness.  Skin: Negative for rash.  Neurological: Negative for dizziness, weakness and headaches.  All other systems reviewed and are negative.    Physical Exam Updated Vital Signs BP (!) 141/93   Pulse 74   Temp 98.3 F (36.8 C) (Oral)   Resp 18   Wt 89.8 kg (198 lb)   LMP 06/22/2017   SpO2 100%   BMI 36.21 kg/m   Physical Exam  Constitutional: She is oriented to person, place, and time. She appears well-developed and well-nourished. No distress.  HENT:  Head: Normocephalic.  Eyes: Conjunctivae are normal.  Neck: Neck supple.  Cardiovascular: Normal rate, regular rhythm and normal heart sounds.  Pulmonary/Chest: Effort normal and breath sounds normal. No respiratory distress. She has no wheezes. She has no rales.  Abdominal: Soft. Bowel sounds are normal. She exhibits no distension. There is tenderness. There is no rebound.  No CVA tenderness bilaterally.  Left lower quadrant tenderness  Musculoskeletal: She exhibits no edema.  Neurological: She is alert and oriented to person, place, and time.  Skin: Skin is warm and dry.  Psychiatric: She has a normal mood and affect. Her behavior is normal.  Nursing note and vitals reviewed.    ED Treatments / Results  Labs (all labs ordered are listed, but only abnormal results are displayed) Labs Reviewed  WET PREP, GENITAL - Abnormal; Notable for the following components:      Result Value   Clue Cells Wet Prep HPF POC PRESENT (*)    WBC, Wet Prep HPF POC FEW (*)    All other components within normal limits  CBC - Abnormal; Notable for the following components:   WBC 11.0 (*)    All other components within normal limits  URINALYSIS, ROUTINE W REFLEX MICROSCOPIC - Abnormal; Notable for the following components:   Hgb urine dipstick MODERATE (*)    Squamous Epithelial / LPF 0-5 (*)    All other components within normal limits  BASIC METABOLIC PANEL  PREGNANCY, URINE  GC/CHLAMYDIA  PROBE AMP ([place]) NOT AT [facility]    EKG  EKG Interpretation None       Radiology US Transvaginal Non-ob  Result Date: 06/25/2017 CLINICAL DATA:  22 year old female with lower abdominal, left hip pain. Nausea vomiting and diarrhea. Noncontrast CT Abdomen and Pelvis today revealing evidence of right ovarian dermoid, and more simple appearing left ovarian cyst. LMP 06/21/2017. EXAM: TRANSABDOMINAL AND TRANSVAGINAL ULTRASOUND OF PELVIS DOPPLER ULTRASOUND OF OVARIES TECHNIQUE: Both transabdominal and transvaginal ultrasound examinations of the pelvis were performed. Transabdominal technique was performed for global imaging of the pelvis including uterus, ovaries, adnexal regions, and pelvic cul-de-sac. It was necessary to proceed with endovaginal exam following the transabdominal exam to visualize the ovaries. Color and duplex Doppler ultrasound was utilized to evaluate blood flow to  the ovaries. COMPARISON:  Noncontrast CT Abdomen and Pelvis 1032 hr today. FINDINGS: Uterus Measurements: 7.8 x 4.1 x 5.0 centimeters. No fibroids or other mass visualized. Endometrium Thickness: 11 millimeters.  No focal abnormality visualized. Right ovary Measurements: 3.8 x 1.9 x 2.8 centimeters. Adjacent and inseparable from the right ovary is an oval echogenic mass with hyperechoic lines and dots. Some associated acoustic shadowing (image 109). No internal vascular elements (same image). This measures 5.7 x 2.8 x 3.9 centimeters. Left ovary Measurements: 5.1 x 2.2 x 4.1 centimeters. Within the left ovary there is an oval 3.7 centimeter hypoechoic mass with a reticular pattern of internal echoes (image 75). No internal vascular elements. Superimposed small left ovarian cysts or follicles. Pulsed Doppler evaluation of both ovaries demonstrates normal low-resistance arterial and venous waveforms. Other findings Trace pelvic free fluid. IMPRESSION: 1. 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 Recommend GYN surgery consultation, and annual follow-up of the lesion if it is not surgically removed. 2. Left ovarian 3.7 cm cyst with benign characteristics typical of a physiologic hemorrhagic cyst. No imaging follow-up necessary (in hemorrhagic cysts up to 5 cm occurring in reproductive age women). 3. Negative for ovarian torsion.  Normal uterus. The above recommendations follow the consensus statement: Management of Asymptomatic Ovarian and Other Adnexal Cysts Imaged at [place]: Society of Radiologists in [place]. Radiology 2010; [phone]. Electronically Signed   By: Genevie Ann M.D.   On: 06/25/2017 12:46   US Pelvis Complete  Result Date: 06/25/2017 CLINICAL DATA:  22 year old female with lower abdominal, left hip pain. Nausea vomiting and diarrhea. Noncontrast CT Abdomen and Pelvis today revealing evidence of right ovarian dermoid, and more simple appearing left ovarian cyst. LMP 06/21/2017. EXAM: TRANSABDOMINAL AND TRANSVAGINAL ULTRASOUND OF PELVIS DOPPLER ULTRASOUND OF OVARIES TECHNIQUE: Both transabdominal and transvaginal ultrasound examinations of the pelvis were performed. Transabdominal technique was performed for global imaging of the pelvis including uterus, ovaries, adnexal regions, and pelvic cul-de-sac. It was necessary to proceed with endovaginal exam following the transabdominal exam to visualize the ovaries. Color and duplex Doppler ultrasound was utilized to evaluate blood flow to the ovaries. COMPARISON:  Noncontrast CT Abdomen and Pelvis 1032 hr today. FINDINGS: Uterus Measurements: 7.8 x 4.1 x 5.0 centimeters. No fibroids or other mass visualized. Endometrium Thickness: 11 millimeters.  No focal abnormality visualized. Right ovary Measurements: 3.8 x 1.9 x 2.8 centimeters. Adjacent and inseparable from the right ovary is an oval echogenic mass with hyperechoic lines and dots. Some associated  acoustic shadowing (image 109). No internal vascular elements (same image). This measures 5.7 x 2.8 x 3.9 centimeters. Left ovary Measurements: 5.1 x 2.2 x 4.1 centimeters. Within the left ovary there is an oval 3.7 centimeter hypoechoic mass with a reticular pattern of internal echoes (image 75). No internal vascular elements. Superimposed small left ovarian cysts or follicles. Pulsed Doppler evaluation of both ovaries demonstrates normal low-resistance arterial and venous waveforms. Other findings Trace pelvic free fluid. IMPRESSION: 1. 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 Recommend GYN surgery consultation, and annual follow-up of the lesion if it is not surgically removed. 2. Left ovarian 3.7 cm cyst with benign characteristics typical of a physiologic hemorrhagic cyst. No imaging follow-up necessary (in hemorrhagic cysts up to 5 cm occurring in reproductive age women). 3. Negative for ovarian torsion.  Normal uterus. The  above recommendations follow the consensus statement: Management of Asymptomatic Ovarian and Other Adnexal Cysts Imaged at [place]: Society of Radiologists in [place]. Radiology 2010; [phone]. Electronically Signed   By: Genevie Ann M.D.   On: 06/25/2017 12:46   [place] Art/ven Flow Abd Pelv Doppler  Result Date: 06/25/2017 CLINICAL DATA:  22 year old female with lower abdominal, left hip pain. Nausea vomiting and diarrhea. Noncontrast CT Abdomen and Pelvis today revealing evidence of right ovarian dermoid, and more simple appearing left ovarian cyst. LMP 06/21/2017. EXAM: TRANSABDOMINAL AND TRANSVAGINAL ULTRASOUND OF PELVIS DOPPLER ULTRASOUND OF OVARIES TECHNIQUE: Both transabdominal and transvaginal ultrasound examinations of the pelvis were performed. Transabdominal technique was performed for global imaging of the pelvis including uterus, ovaries, adnexal regions,  and pelvic cul-de-sac. It was necessary to proceed with endovaginal exam following the transabdominal exam to visualize the ovaries. Color and duplex Doppler ultrasound was utilized to evaluate blood flow to the ovaries. COMPARISON:  Noncontrast CT Abdomen and Pelvis 1032 hr today. FINDINGS: Uterus Measurements: 7.8 x 4.1 x 5.0 centimeters. No fibroids or other mass visualized. Endometrium Thickness: 11 millimeters.  No focal abnormality visualized. Right ovary Measurements: 3.8 x 1.9 x 2.8 centimeters. Adjacent and inseparable from the right ovary is an oval echogenic mass with hyperechoic lines and dots. Some associated acoustic shadowing (image 109). No internal vascular elements (same image). This measures 5.7 x 2.8 x 3.9 centimeters. Left ovary Measurements: 5.1 x 2.2 x 4.1 centimeters. Within the left ovary there is an oval 3.7 centimeter hypoechoic mass with a reticular pattern of internal echoes (image 75). No internal vascular elements. Superimposed small left ovarian cysts or follicles. Pulsed Doppler evaluation of both ovaries demonstrates normal low-resistance arterial and venous waveforms. Other findings Trace pelvic free fluid. IMPRESSION: 1. 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 Recommend GYN surgery consultation, and annual follow-up of the lesion if it is not surgically removed. 2. Left ovarian 3.7 cm cyst with benign characteristics typical of a physiologic hemorrhagic cyst. No imaging follow-up necessary (in hemorrhagic cysts up to 5 cm occurring in reproductive age women). 3. Negative for ovarian torsion.  Normal uterus. The above recommendations follow the consensus statement: Management of Asymptomatic Ovarian and Other Adnexal Cysts Imaged at [place]: Society of Radiologists in [place]. Radiology 2010; [phone]. Electronically Signed   By: Genevie Ann M.D.   On:  06/25/2017 12:46   Ct Renal Stone Study  Result Date: 06/25/2017 CLINICAL DATA:  22 year old female with left hip and lower abdominal pain. Vomiting, nausea and diarrhea. Initial encounter. EXAM: CT ABDOMEN AND PELVIS WITHOUT CONTRAST TECHNIQUE: Multidetector CT imaging of the abdomen and pelvis was performed following the standard protocol without IV contrast. COMPARISON:  None. FINDINGS: Lower chest: Lung bases clear.  Heart size top-normal. Hepatobiliary: Taking into account limitation by non contrast imaging, no worrisome hepatic lesion. Gallbladder sludge. Pancreas: Taking into account limitation by non contrast imaging, no pancreatic mass or inflammation. Spleen: Taking into account limitation by non contrast imaging, no splenic mass or enlargement. Adrenals/Urinary Tract: No obstructing stone or hydronephrosis. Taking into account limitation by non contrast imaging, no renal or adrenal mass. Noncontrast filled views of the urinary bladder unremarkable. Stomach/Bowel: No primary bowel inflammatory process noted. Specifically, no inflammation surrounds the appendix or sigmoid colon. Prominent submucosal fat cecal region of indeterminate etiology/significance. Vascular/Lymphatic: No aortic aneurysm.  No adenopathy. Reproductive: 5.3 x 3.4 x 3.5 cm fat  containing structure right adnexal region. 4.1 x 3.5 x 3.7 cm cyst left adnexa region. Other: Trace free fluid may be related to recent ovulation. No free intraperitoneal air. Musculoskeletal: No worrisome osseous abnormality. Specifically, hips appear within normal limits. Mild degenerative changes pubic symphysis. IMPRESSION: No renal or ureteral obstructing stone or hydronephrosis. 5.3 x 3.4 x 3.5 cm fat containing structure right adnexal region consistent with dermoid. 4.1 x 3.5 x 3.7 cm cyst left adnexa region. Pelvic sonogram with Doppler examination may be considered for further delineation to help exclude associated ovarian torsion. Trace free fluid  probably related to recent ovulation. No discrete bowel inflammatory process identified. Specifically, no inflammation surrounds the appendix or sigmoid colon. These results were called by telephone at the time of interpretation on 06/25/2017 at 11:06 am to Dr. Laverta Baltimore, Who verbally acknowledged these results. Electronically Signed   By: Genia Del M.D.   On: 06/25/2017 11:07    Procedures Procedures (including critical care time)  Medications Ordered in ED Medications  morphine 4 MG/ML injection 4 mg (not administered)  ondansetron (ZOFRAN) injection 4 mg (not administered)     Initial Impression / Assessment and Plan / ED Course  I have reviewed the triage vital signs and the nursing notes.  Pertinent labs & imaging results that were available during my care of the patient were reviewed by me and considered in my medical decision making (see chart for details).     Patient seen and examined, patient with left lower quadrant abdominal pain radiating to the flank.  No history of similar pain in the past.  Patient appears to be very uncomfortable, tearful.  She did have associated nausea and vomiting differential includes kidney stone, ovarian torsion, ectopic pregnancy, ovarian cyst, diverticulitis.  Will give pain medications, antiemetics, will get basic labs, perform pelvic exam.  11:20 AM CT showing dermoid mass over left ovary, will get [place] to RO torsion and furter evaluate cyst/mass  Pt's US shows ovarian dermoid, however, pt does not have any pain on that side. Will recommend follow up with GYN. Left ovariacy cyst appears bening, but could be the cause of her pain. Plan to dc home with medications and follow up with pcp.   Vitals:   06/25/17 1132 06/25/17 1306 06/25/17 1400 06/25/17 1431  BP: 127/68 (!) 172/98 126/81   Pulse: 79 75  91  Resp:    16  Temp:    97.6 F (36.4 C)  TempSrc:    Oral  SpO2: 100% 100%  100%  Weight:         Final Clinical Impressions(s) / ED  Diagnoses   Final diagnoses:  Ovarian cyst, left  Cyst, ovary, dermoid, right    ED Discharge Orders        Ordered    traMADol (ULTRAM) 50 MG tablet  Every 6 hours PRN     06/25/17 1408    ondansetron (ZOFRAN) 4 MG tablet  Every 8 hours PRN     06/25/17 1408       Marc Morgans [place], PA-C 06/25/17 1703    Margette Fast, MD 06/25/17 1920

## 2017-06-25 NOTE — Discharge Instructions (Signed)
You have an ovarian cyst on the left ovary which is most likely what is causing her pain.  He will also have a dermoid cyst on right ovary which needs to be closely followed by OB/GYN doctor.  You can follow-up with your doctor or follow-up with Dr. Glo Herring as referred.  Take pain medications and nausea medications as prescribed.  Return if worsening symptoms.

## 2017-06-25 NOTE — ED Triage Notes (Signed)
Pt complaining left hip lower abd pain woke her up during middle of night . Emesis X 2, nausea diarrheaX1. Pt denies fever or chills. Pt is on her menstrual cycle but has been on it for 4 days with no change in period cramping or amount of blood.

## 2017-06-26 ENCOUNTER — Emergency Department (HOSPITAL_COMMUNITY)
Admission: EM | Admit: 2017-06-26 | Discharge: 2017-06-26 | Disposition: A | Discharge status: Home / Self Care | Payer: Self-pay | Attending: Emergency Medicine | Admitting: Emergency Medicine

## 2017-06-26 ENCOUNTER — Encounter (HOSPITAL_COMMUNITY): Payer: Self-pay

## 2017-06-26 ENCOUNTER — Other Ambulatory Visit: Payer: Self-pay

## 2017-06-26 DIAGNOSIS — J45909 Unspecified asthma, uncomplicated: Secondary | ICD-10-CM | POA: Insufficient documentation

## 2017-06-26 DIAGNOSIS — A549 Gonococcal infection, unspecified: Secondary | ICD-10-CM | POA: Insufficient documentation

## 2017-06-26 DIAGNOSIS — F17210 Nicotine dependence, cigarettes, uncomplicated: Secondary | ICD-10-CM | POA: Insufficient documentation

## 2017-06-26 LAB — GC/CHLAMYDIA PROBE AMP (~~LOC~~) NOT AT ARMC
Chlamydia: NEGATIVE
NEISSERIA GONORRHEA: POSITIVE — AB

## 2017-06-26 MED ORDER — AZITHROMYCIN 250 MG PO TABS
1000.0000 mg | ORAL_TABLET | Freq: Once | ORAL | Status: AC
  Administered 2017-06-26: 1000 mg via ORAL
  Filled 2017-06-26: qty 4

## 2017-06-26 MED ORDER — CEFTRIAXONE SODIUM 250 MG IJ SOLR
250.0000 mg | Freq: Once | INTRAMUSCULAR | Status: AC
  Administered 2017-06-26: 250 mg via INTRAMUSCULAR
  Filled 2017-06-26: qty 250

## 2017-06-26 MED ORDER — LIDOCAINE HCL (PF) 1 % IJ SOLN
INTRAMUSCULAR | Status: AC
  Filled 2017-06-26: qty 5

## 2017-06-26 NOTE — ED Triage Notes (Signed)
Patient states she received call from hospital to come get shot for positive STD results. No new complaints.

## 2017-06-26 NOTE — ED Provider Notes (Signed)
[facility] EMERGENCY DEPARTMENT Provider Note   CSN: [id number] Arrival date & time: 06/26/17  1716     History   Chief Complaint Chief Complaint  Patient presents with  . Follow-up    HPI Christy Goodman is a 22 y.o. female.  Patient is a 22 year old female who presents to the emergency department at the instruction of the flow managers office because of a positive STD test.  The patient was recently examined in the emergency department.  Cultures were obtained because of abdomen pain.  The patient's gonorrhea test came back positive and she was told to come to the emergency department for treatment.  The patient denies drainage or discharge at this time.  She denies fever or chills.      Past Medical History:  Diagnosis Date  . Asthma   . Gonorrhea 06/06/2016  . H/O seasonal allergies     Patient Active Problem List   Diagnosis Date Noted  . Gonorrhea 06/06/2016  . Skin ulcer of buttock ([place]), resolved with  hyperpigmentation 04/27/2015  . Herpes genitalis in women 04/12/2015  . Sore throat 10/02/2013  . Sinusitis, acute 10/02/2013    History reviewed. No pertinent surgical history.  OB History    Gravida Para Term Preterm AB Living   0 0 0 0 0 0   SAB TAB Ectopic Multiple Live Births   0 0 0 0 0       Home Medications    Prior to Admission medications   Medication Sig Start Date End Date Taking? Authorizing Provider  diphenhydrAMINE (BENADRYL) 25 mg capsule Take 1 capsule (25 mg total) by mouth every 6 (six) hours as needed. Patient not taking: Reported on 06/25/2017 12/25/16   Delia Heady, PA-C  ondansetron (ZOFRAN) 4 MG tablet Take 1 tablet (4 mg total) by mouth every 8 (eight) hours as needed for nausea or vomiting. 06/25/17   Kirichenko, Lahoma Rocker, PA-C  traMADol (ULTRAM) 50 MG tablet Take 1 tablet (50 mg total) by mouth every 6 (six) hours as needed. 06/25/17   Jeannett Senior, PA-C    Family History Family History  Problem Relation Age of  Onset  . Cancer Paternal Grandmother   . Diabetes Paternal Grandmother   . Diabetes Paternal Aunt     Social History Social History   Tobacco Use  . Smoking status: Current Some Day Smoker    Packs/day: 0.25    Years: 1.00    Pack years: 0.25    Types: Cigarettes  . Smokeless tobacco: Never Used  Substance Use Topics  . Alcohol use: Yes    Comment: occ  . Drug use: No     Allergies   Patient has no known allergies.   Review of Systems Review of Systems  Constitutional: Negative for activity change.       All ROS Neg except as noted in HPI  HENT: Negative for nosebleeds.   Eyes: Negative for photophobia and discharge.  Respiratory: Negative for cough, shortness of breath and wheezing.   Cardiovascular: Negative for chest pain and palpitations.  Gastrointestinal: Negative for abdominal pain and blood in stool.  Genitourinary: Negative for dysuria, frequency and hematuria.  Musculoskeletal: Negative for arthralgias, back pain and neck pain.  Skin: Negative.   Neurological: Negative for dizziness, seizures and speech difficulty.  Psychiatric/Behavioral: Negative for confusion and hallucinations.     Physical Exam Updated Vital Signs BP (!) 149/97 (BP Location: Left Arm)   Pulse 96   Temp 98.7 F (37.1 C) (Oral)  Resp 18   Ht 5\' 2"  (1.575 m)   Wt 89.8 kg (198 lb)   LMP 06/22/2017   SpO2 100%   BMI 36.21 kg/m   Physical Exam  Constitutional: She is oriented to person, place, and time. She appears well-developed and well-nourished.  Non-toxic appearance.  HENT:  Head: Normocephalic.  Right Ear: Tympanic membrane and external ear normal.  Left Ear: Tympanic membrane and external ear normal.  Eyes: EOM and lids are normal. Pupils are equal, round, and reactive to light.  Neck: Normal range of motion. Neck supple. Carotid bruit is not present.  Cardiovascular: Normal rate, regular rhythm, normal heart sounds, intact distal pulses and normal pulses.    Pulmonary/Chest: Breath sounds normal. No respiratory distress.  Abdominal: Soft. Bowel sounds are normal. There is no tenderness. There is no guarding.  Musculoskeletal: Normal range of motion.  Lymphadenopathy:       Head (right side): No submandibular adenopathy present.       Head (left side): No submandibular adenopathy present.    She has no cervical adenopathy.  Neurological: She is alert and oriented to person, place, and time. She has normal strength. No cranial nerve deficit or sensory deficit.  Skin: Skin is warm and dry.  Psychiatric: She has a normal mood and affect. Her speech is normal.  Nursing note and vitals reviewed.    ED Treatments / Results  Labs (all labs ordered are listed, but only abnormal results are displayed) Labs Reviewed - No data to display  EKG  EKG Interpretation None       Radiology US Transvaginal Non-ob  Result Date: 06/25/2017 CLINICAL DATA:  22 year old female with lower abdominal, left hip pain. Nausea vomiting and diarrhea. Noncontrast CT Abdomen and Pelvis today revealing evidence of right ovarian dermoid, and more simple appearing left ovarian cyst. LMP 06/21/2017. EXAM: TRANSABDOMINAL AND TRANSVAGINAL ULTRASOUND OF PELVIS DOPPLER ULTRASOUND OF OVARIES TECHNIQUE: Both transabdominal and transvaginal ultrasound examinations of the pelvis were performed. Transabdominal technique was performed for global imaging of the pelvis including uterus, ovaries, adnexal regions, and pelvic cul-de-sac. It was necessary to proceed with endovaginal exam following the transabdominal exam to visualize the ovaries. Color and duplex Doppler ultrasound was utilized to evaluate blood flow to the ovaries. COMPARISON:  Noncontrast CT Abdomen and Pelvis 1032 hr today. FINDINGS: Uterus Measurements: 7.8 x 4.1 x 5.0 centimeters. No fibroids or other mass visualized. Endometrium Thickness: 11 millimeters.  No focal abnormality visualized. Right ovary Measurements: 3.8 x  1.9 x 2.8 centimeters. Adjacent and inseparable from the right ovary is an oval echogenic mass with hyperechoic lines and dots. Some associated acoustic shadowing (image 109). No internal vascular elements (same image). This measures 5.7 x 2.8 x 3.9 centimeters. Left ovary Measurements: 5.1 x 2.2 x 4.1 centimeters. Within the left ovary there is an oval 3.7 centimeter hypoechoic mass with a reticular pattern of internal echoes (image 75). No internal vascular elements. Superimposed small left ovarian cysts or follicles. Pulsed Doppler evaluation of both ovaries demonstrates normal low-resistance arterial and venous waveforms. Other findings Trace pelvic free fluid. IMPRESSION: 1. 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 Recommend GYN surgery consultation, and annual follow-up of the lesion if it is not surgically removed. 2. Left ovarian 3.7 cm cyst with benign characteristics typical of a physiologic hemorrhagic cyst. No imaging follow-up necessary (in hemorrhagic cysts up to 5 cm occurring in reproductive age women). 3. Negative  for ovarian torsion.  Normal uterus. The above recommendations follow the consensus statement: Management of Asymptomatic Ovarian and Other Adnexal Cysts Imaged at [place]: Society of Radiologists in [place]. Radiology 2010; [phone]. Electronically Signed   By: Genevie Ann M.D.   On: 06/25/2017 12:46   US Pelvis Complete  Result Date: 06/25/2017 CLINICAL DATA:  22 year old female with lower abdominal, left hip pain. Nausea vomiting and diarrhea. Noncontrast CT Abdomen and Pelvis today revealing evidence of right ovarian dermoid, and more simple appearing left ovarian cyst. LMP 06/21/2017. EXAM: TRANSABDOMINAL AND TRANSVAGINAL ULTRASOUND OF PELVIS DOPPLER ULTRASOUND OF OVARIES TECHNIQUE: Both transabdominal and transvaginal ultrasound examinations of the pelvis  were performed. Transabdominal technique was performed for global imaging of the pelvis including uterus, ovaries, adnexal regions, and pelvic cul-de-sac. It was necessary to proceed with endovaginal exam following the transabdominal exam to visualize the ovaries. Color and duplex Doppler ultrasound was utilized to evaluate blood flow to the ovaries. COMPARISON:  Noncontrast CT Abdomen and Pelvis 1032 hr today. FINDINGS: Uterus Measurements: 7.8 x 4.1 x 5.0 centimeters. No fibroids or other mass visualized. Endometrium Thickness: 11 millimeters.  No focal abnormality visualized. Right ovary Measurements: 3.8 x 1.9 x 2.8 centimeters. Adjacent and inseparable from the right ovary is an oval echogenic mass with hyperechoic lines and dots. Some associated acoustic shadowing (image 109). No internal vascular elements (same image). This measures 5.7 x 2.8 x 3.9 centimeters. Left ovary Measurements: 5.1 x 2.2 x 4.1 centimeters. Within the left ovary there is an oval 3.7 centimeter hypoechoic mass with a reticular pattern of internal echoes (image 75). No internal vascular elements. Superimposed small left ovarian cysts or follicles. Pulsed Doppler evaluation of both ovaries demonstrates normal low-resistance arterial and venous waveforms. Other findings Trace pelvic free fluid. IMPRESSION: 1. 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 Recommend GYN surgery consultation, and annual follow-up of the lesion if it is not surgically removed. 2. Left ovarian 3.7 cm cyst with benign characteristics typical of a physiologic hemorrhagic cyst. No imaging follow-up necessary (in hemorrhagic cysts up to 5 cm occurring in reproductive age women). 3. Negative for ovarian torsion.  Normal uterus. The above recommendations follow the consensus statement: Management of Asymptomatic Ovarian and Other Adnexal Cysts Imaged at [place]: Society of  Radiologists in [place]. Radiology 2010; [phone]. Electronically Signed   By: Genevie Ann M.D.   On: 06/25/2017 12:46   [place] Art/ven Flow Abd Pelv Doppler  Result Date: 06/25/2017 CLINICAL DATA:  22 year old female with lower abdominal, left hip pain. Nausea vomiting and diarrhea. Noncontrast CT Abdomen and Pelvis today revealing evidence of right ovarian dermoid, and more simple appearing left ovarian cyst. LMP 06/21/2017. EXAM: TRANSABDOMINAL AND TRANSVAGINAL ULTRASOUND OF PELVIS DOPPLER ULTRASOUND OF OVARIES TECHNIQUE: Both transabdominal and transvaginal ultrasound examinations of the pelvis were performed. Transabdominal technique was performed for global imaging of the pelvis including uterus, ovaries, adnexal regions, and pelvic cul-de-sac. It was necessary to proceed with endovaginal exam following the transabdominal exam to visualize the ovaries. Color and duplex Doppler ultrasound was utilized to evaluate blood flow to the ovaries. COMPARISON:  Noncontrast CT Abdomen and Pelvis 1032 hr today. FINDINGS: Uterus Measurements: 7.8 x 4.1 x 5.0 centimeters. No fibroids or other mass visualized. Endometrium Thickness: 11 millimeters.  No focal abnormality visualized. Right ovary Measurements: 3.8 x 1.9 x 2.8 centimeters. Adjacent and inseparable from the right ovary is an oval  echogenic mass with hyperechoic lines and dots. Some associated acoustic shadowing (image 109). No internal vascular elements (same image). This measures 5.7 x 2.8 x 3.9 centimeters. Left ovary Measurements: 5.1 x 2.2 x 4.1 centimeters. Within the left ovary there is an oval 3.7 centimeter hypoechoic mass with a reticular pattern of internal echoes (image 75). No internal vascular elements. Superimposed small left ovarian cysts or follicles. Pulsed Doppler evaluation of both ovaries demonstrates normal low-resistance arterial and venous waveforms. Other findings Trace pelvic free fluid. IMPRESSION: 1.  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 Recommend GYN surgery consultation, and annual follow-up of the lesion if it is not surgically removed. 2. Left ovarian 3.7 cm cyst with benign characteristics typical of a physiologic hemorrhagic cyst. No imaging follow-up necessary (in hemorrhagic cysts up to 5 cm occurring in reproductive age women). 3. Negative for ovarian torsion.  Normal uterus. The above recommendations follow the consensus statement: Management of Asymptomatic Ovarian and Other Adnexal Cysts Imaged at [place]: Society of Radiologists in [place]. Radiology 2010; [phone]. Electronically Signed   By: Genevie Ann M.D.   On: 06/25/2017 12:46   Ct Renal Stone Study  Result Date: 06/25/2017 CLINICAL DATA:  22 year old female with left hip and lower abdominal pain. Vomiting, nausea and diarrhea. Initial encounter. EXAM: CT ABDOMEN AND PELVIS WITHOUT CONTRAST TECHNIQUE: Multidetector CT imaging of the abdomen and pelvis was performed following the standard protocol without IV contrast. COMPARISON:  None. FINDINGS: Lower chest: Lung bases clear.  Heart size top-normal. Hepatobiliary: Taking into account limitation by non contrast imaging, no worrisome hepatic lesion. Gallbladder sludge. Pancreas: Taking into account limitation by non contrast imaging, no pancreatic mass or inflammation. Spleen: Taking into account limitation by non contrast imaging, no splenic mass or enlargement. Adrenals/Urinary Tract: No obstructing stone or hydronephrosis. Taking into account limitation by non contrast imaging, no renal or adrenal mass. Noncontrast filled views of the urinary bladder unremarkable. Stomach/Bowel: No primary bowel inflammatory process noted. Specifically, no inflammation surrounds the appendix or sigmoid colon. Prominent submucosal fat cecal region of indeterminate  etiology/significance. Vascular/Lymphatic: No aortic aneurysm.  No adenopathy. Reproductive: 5.3 x 3.4 x 3.5 cm fat containing structure right adnexal region. 4.1 x 3.5 x 3.7 cm cyst left adnexa region. Other: Trace free fluid may be related to recent ovulation. No free intraperitoneal air. Musculoskeletal: No worrisome osseous abnormality. Specifically, hips appear within normal limits. Mild degenerative changes pubic symphysis. IMPRESSION: No renal or ureteral obstructing stone or hydronephrosis. 5.3 x 3.4 x 3.5 cm fat containing structure right adnexal region consistent with dermoid. 4.1 x 3.5 x 3.7 cm cyst left adnexa region. Pelvic sonogram with Doppler examination may be considered for further delineation to help exclude associated ovarian torsion. Trace free fluid probably related to recent ovulation. No discrete bowel inflammatory process identified. Specifically, no inflammation surrounds the appendix or sigmoid colon. These results were called by telephone at the time of interpretation on 06/25/2017 at 11:06 am to Dr. Laverta Baltimore, Who verbally acknowledged these results. Electronically Signed   By: Genia Del M.D.   On: 06/25/2017 11:07    Procedures Procedures (including critical care time)  Medications Ordered in ED Medications  lidocaine (PF) (XYLOCAINE) 1 % injection (not administered)  cefTRIAXone (ROCEPHIN) injection 250 mg (250 mg Intramuscular Given 06/26/17 2041)  azithromycin (ZITHROMAX) tablet 1,000 mg (1,000 mg Oral Given 06/26/17 2041)     Initial Impression / Assessment and  Plan / ED Course  I have reviewed the triage vital signs and the nursing notes.  Pertinent labs & imaging results that were available during my care of the patient were reviewed by me and considered in my medical decision making (see chart for details).       Final Clinical Impressions(s) / ED Diagnoses MDM  Blood pressure is elevated at 149/97, otherwise vital signs are within normal limits.   Patient  was treated in the emergency department with intramuscular Rocephin and oral Zithromax.  Patient was given instructions on gonorrhea.  Patient was also given instructions on safe sex.  I encouraged the patient to practice safe sex on each sexual encounter.  Patient acknowledges understanding of the instructions.    Final diagnoses:  Gonorrhea in female    ED Discharge Orders    None       Lily Kocher, Hershal Coria 06/26/17 2049    Daleen Bo, MD 06/29/17 [phone]

## 2017-06-26 NOTE — Discharge Instructions (Signed)
The you were treated tonight with Rocephin for a positive STD test.  Please refrain from sexual activity over the next 10 days.  Please practice safe sex each time you have a sexual encounter.  Please encourage your sexual partner or partners to see the physicians at health department so that they can be tested and were treated.

## 2017-06-26 NOTE — ED Notes (Signed)
Pt ambulatory to waiting room. Pt verbalized understanding of discharge instructions.   

## 2017-07-04 ENCOUNTER — Ambulatory Visit: Payer: Self-pay | Admitting: Obstetrics and Gynecology

## 2018-04-17 ENCOUNTER — Other Ambulatory Visit: Payer: Self-pay | Admitting: Advanced Practice Midwife

## 2018-05-30 ENCOUNTER — Other Ambulatory Visit: Payer: Self-pay | Admitting: Advanced Practice Midwife

## 2019-03-17 ENCOUNTER — Emergency Department (HOSPITAL_COMMUNITY): Payer: No Typology Code available for payment source

## 2019-03-17 ENCOUNTER — Encounter (HOSPITAL_COMMUNITY): Payer: Self-pay | Admitting: Emergency Medicine

## 2019-03-17 ENCOUNTER — Emergency Department (HOSPITAL_COMMUNITY)
Admission: EM | Admit: 2019-03-17 | Discharge: 2019-03-17 | Disposition: A | Discharge status: Home / Self Care | Payer: No Typology Code available for payment source | Attending: Emergency Medicine | Admitting: Emergency Medicine

## 2019-03-17 ENCOUNTER — Other Ambulatory Visit: Payer: Self-pay

## 2019-03-17 DIAGNOSIS — J45909 Unspecified asthma, uncomplicated: Secondary | ICD-10-CM | POA: Insufficient documentation

## 2019-03-17 DIAGNOSIS — Z79899 Other long term (current) drug therapy: Secondary | ICD-10-CM | POA: Diagnosis not present

## 2019-03-17 DIAGNOSIS — M79672 Pain in left foot: Secondary | ICD-10-CM | POA: Insufficient documentation

## 2019-03-17 DIAGNOSIS — M25551 Pain in right hip: Secondary | ICD-10-CM

## 2019-03-17 DIAGNOSIS — F17210 Nicotine dependence, cigarettes, uncomplicated: Secondary | ICD-10-CM | POA: Diagnosis not present

## 2019-03-17 LAB — I-STAT BETA HCG BLOOD, ED (MC, WL, AP ONLY): I-stat hCG, quantitative: <5 m[IU]/mL (ref <5)

## 2019-03-17 MED ORDER — NAPROXEN 500 MG PO TABS: 500.0000 mg | ORAL_TABLET | Freq: Two times a day (BID) | ORAL | 0 refills | Status: DC

## 2019-03-17 MED ORDER — METHOCARBAMOL 500 MG PO TABS: 500.0000 mg | ORAL_TABLET | Freq: Two times a day (BID) | ORAL | 0 refills | Status: DC | PRN
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
## 2019-03-17 NOTE — Discharge Instructions (Addendum)
Take naproxen 2 times a day with meals.  Do not take other anti-inflammatories at the same time (Advil, Motrin, ibuprofen, Aleve). You may supplement with Tylenol if you need further pain control. Use robaxin as needed for muscle stiffness or soreness.  Have caution, this may make you tired or groggy.  Do not drive or operate heavy machinery while taking this medicine. Use ice packs or heating pads if this helps control your pain. Use muscle creams (salonpas, icy hot, bengay, biofreeze) as needed.  You will likely have continued muscle stiffness and soreness over the next couple days.  Follow-up with primary care in 1 week if your symptoms are not improving. Return to the emergency room if you develop vision changes, vomiting, slurred speech, numbness, loss of bowel or bladder control, or any new or worsening symptoms.
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
## 2019-04-05 ENCOUNTER — Other Ambulatory Visit: Payer: Self-pay

## 2019-04-05 ENCOUNTER — Emergency Department (HOSPITAL_COMMUNITY): Payer: No Typology Code available for payment source

## 2019-04-05 ENCOUNTER — Emergency Department (HOSPITAL_COMMUNITY)
Admission: EM | Admit: 2019-04-05 | Discharge: 2019-04-05 | Disposition: A | Discharge status: Home / Self Care | Payer: No Typology Code available for payment source | Attending: Emergency Medicine | Admitting: Emergency Medicine

## 2019-04-05 ENCOUNTER — Encounter (HOSPITAL_COMMUNITY): Payer: Self-pay

## 2019-04-05 DIAGNOSIS — S7011XD Contusion of right thigh, subsequent encounter: Secondary | ICD-10-CM | POA: Diagnosis not present

## 2019-04-05 DIAGNOSIS — S7011XA Contusion of right thigh, initial encounter: Secondary | ICD-10-CM

## 2019-04-05 DIAGNOSIS — J45909 Unspecified asthma, uncomplicated: Secondary | ICD-10-CM | POA: Diagnosis not present

## 2019-04-05 DIAGNOSIS — M25562 Pain in left knee: Secondary | ICD-10-CM | POA: Insufficient documentation

## 2019-04-05 DIAGNOSIS — F17210 Nicotine dependence, cigarettes, uncomplicated: Secondary | ICD-10-CM | POA: Diagnosis not present

## 2019-04-05 DIAGNOSIS — M25572 Pain in left ankle and joints of left foot: Secondary | ICD-10-CM | POA: Diagnosis not present

## 2019-04-05 DIAGNOSIS — M25551 Pain in right hip: Secondary | ICD-10-CM | POA: Diagnosis present
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
## 2019-04-05 NOTE — Discharge Instructions (Signed)
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
## 2019-04-29 ENCOUNTER — Encounter (HOSPITAL_COMMUNITY): Payer: Self-pay | Admitting: Emergency Medicine

## 2019-04-29 ENCOUNTER — Emergency Department (HOSPITAL_COMMUNITY)
Admission: EM | Admit: 2019-04-29 | Discharge: 2019-04-29 | Disposition: A | Discharge status: Home / Self Care | Payer: Self-pay | Attending: Emergency Medicine | Admitting: Emergency Medicine

## 2019-04-29 ENCOUNTER — Emergency Department (HOSPITAL_COMMUNITY): Payer: Self-pay

## 2019-04-29 ENCOUNTER — Other Ambulatory Visit: Payer: Self-pay

## 2019-04-29 DIAGNOSIS — R0789 Other chest pain: Secondary | ICD-10-CM | POA: Insufficient documentation

## 2019-04-29 DIAGNOSIS — F17210 Nicotine dependence, cigarettes, uncomplicated: Secondary | ICD-10-CM | POA: Insufficient documentation

## 2019-04-29 DIAGNOSIS — J069 Acute upper respiratory infection, unspecified: Secondary | ICD-10-CM | POA: Insufficient documentation

## 2019-04-29 DIAGNOSIS — J45909 Unspecified asthma, uncomplicated: Secondary | ICD-10-CM | POA: Insufficient documentation

## 2019-04-29 DIAGNOSIS — Z20828 Contact with and (suspected) exposure to other viral communicable diseases: Secondary | ICD-10-CM | POA: Insufficient documentation

## 2019-04-29 DIAGNOSIS — R070 Pain in throat: Secondary | ICD-10-CM | POA: Insufficient documentation

## 2019-04-29 DIAGNOSIS — Z20822 Contact with and (suspected) exposure to covid-19: Secondary | ICD-10-CM

## 2019-04-29 DIAGNOSIS — F17290 Nicotine dependence, other tobacco product, uncomplicated: Secondary | ICD-10-CM | POA: Insufficient documentation

## 2019-04-29 LAB — POC SARS CORONAVIRUS 2 AG -  ED: SARS Coronavirus 2 Ag: NEGATIVE

## 2019-04-29 MED ORDER — ALBUTEROL SULFATE HFA 108 (90 BASE) MCG/ACT IN AERS
4.0000 | INHALATION_SPRAY | Freq: Once | RESPIRATORY_TRACT | Status: AC
  Administered 2019-04-29: 4 via RESPIRATORY_TRACT
  Filled 2019-04-29: qty 6.7

## 2019-04-29 NOTE — ED Provider Notes (Signed)
[facility] EMERGENCY DEPARTMENT Provider Note   CSN: [id number] Arrival date & time: 04/29/19  [id number]     History   Chief Complaint Chief Complaint  Patient presents with  . Cough    HPI Christy Goodman is a 23 y.o. female.     Christy Goodman is a 23 y.o. female with a history of asthma and seasonal allergies, who presents to the emergency department for evaluation of cough.  Patient reports over the past 3 days she has had a sore and scratchy throat, some nasal drainage and congestion and a worsening cough.  She reports at first the cough was mild but now it has become persistent and is productive of clear sputum.  She denies associated fevers or chills.  Reports some chest soreness when she coughs but no persistent chest pain or shortness of breath.  She does feel like her chest gets tight after a coughing spell which is typical for her with her asthma, she reports she is out of her inhaler, reports that now her asthma usually only acts up when she is sick.  She denies any loss of taste or smell.  No associated diarrhea, vomiting or abdominal pain.  She denies headaches, no myalgias.  She does report that she works at [employer] and they have had multiple people test positive for coronavirus at this [place].  She left work today due to worsening cough and was sent for Covid testing.      Past Medical History:  Diagnosis Date  . Asthma   . Gonorrhea 06/06/2016  . H/O seasonal allergies     Patient Active Problem List   Diagnosis Date Noted  . Gonorrhea 06/06/2016  . Skin ulcer of buttock ([place]), resolved with  hyperpigmentation 04/27/2015  . Herpes genitalis in women 04/12/2015  . Sore throat 10/02/2013  . Sinusitis, acute 10/02/2013    History reviewed. No pertinent surgical history.   OB History    Gravida  0   Para  0   Term  0   Preterm  0   AB  0   Living  0     SAB  0   TAB  0   Ectopic  0   Multiple  0   Live Births  0            Home  Medications    Prior to Admission medications   Medication Sig Start Date End Date Taking? Authorizing Provider  diphenhydrAMINE (BENADRYL) 25 mg capsule Take 1 capsule (25 mg total) by mouth every 6 (six) hours as needed. Patient not taking: Reported on 06/25/2017 12/25/16   Delia Heady, PA-C  methocarbamol (ROBAXIN) 500 MG tablet Take 1 tablet (500 mg total) by mouth 2 (two) times daily as needed for muscle spasms. 03/17/19   Caccavale, Sophia, PA-C  naproxen (NAPROSYN) 500 MG tablet Take 1 tablet (500 mg total) by mouth 2 (two) times daily with a meal. 03/17/19   Caccavale, Sophia, PA-C  ondansetron (ZOFRAN) 4 MG tablet Take 1 tablet (4 mg total) by mouth every 8 (eight) hours as needed for nausea or vomiting. 06/25/17   Kirichenko, Lahoma Rocker, PA-C  traMADol (ULTRAM) 50 MG tablet Take 1 tablet (50 mg total) by mouth every 6 (six) hours as needed. 06/25/17   Jeannett Senior, PA-C    Family History Family History  Problem Relation Age of Onset  . Cancer Paternal Grandmother   . Diabetes Paternal Grandmother   . Diabetes Paternal Aunt  Social History Social History   Tobacco Use  . Smoking status: Current Some Day Smoker    Packs/day: 0.25    Years: 1.00    Pack years: 0.25    Types: Cigarettes, Cigars  . Smokeless tobacco: Never Used  Substance Use Topics  . Alcohol use: Yes    Comment: occ  . Drug use: No     Allergies   Patient has no known allergies.   Review of Systems Review of Systems  Constitutional: Negative for chills and fever.  HENT: Positive for congestion, rhinorrhea and sore throat.   Respiratory: Positive for cough and chest tightness. Negative for shortness of breath.   Cardiovascular: Negative for chest pain.  Gastrointestinal: Negative for abdominal pain, diarrhea, nausea and vomiting.  Musculoskeletal: Negative for myalgias.  Skin: Negative for color change and rash.  Neurological: Negative for headaches.  All other systems reviewed and are  negative.    Physical Exam Updated Vital Signs BP 116/80 (BP Location: Left Arm)   Pulse 95   Temp 98.3 F (36.8 C) (Oral)   Resp 18   Ht 5\' 2"  (1.575 m)   Wt 90.7 kg   LMP 04/05/2019   SpO2 98%   BMI 36.58 kg/m   Physical Exam Vitals signs and nursing note reviewed.  Constitutional:      General: She is not in acute distress.    Appearance: Normal appearance. She is well-developed. She is not ill-appearing or diaphoretic.     Comments: Well-appearing and in no distress  HENT:     Head: Normocephalic and atraumatic.     Nose: Congestion and rhinorrhea present.     Comments: Bilateral nares patent with moderate mucosal edema and clear rhinorrhea present.     Mouth/Throat:     Comments: Posterior oropharynx clear and mucous membranes moist, there is mild erythema but no edema or tonsillar exudates, uvula midline, normal phonation, no trismus, tolerating secretions without difficulty. Eyes:     General:        Right eye: No discharge.        Left eye: No discharge.  Neck:     Musculoskeletal: Neck supple.     Comments: No rigidity Cardiovascular:     Rate and Rhythm: Normal rate and regular rhythm.     Heart sounds: Normal heart sounds.  Pulmonary:     Effort: Pulmonary effort is normal. No respiratory distress.     Breath sounds: Normal breath sounds.     Comments: Respirations equal and unlabored, patient able to speak in full sentences, lungs clear to auscultation bilaterally Abdominal:     General: Bowel sounds are normal. There is no distension.     Palpations: Abdomen is soft. There is no mass.     Tenderness: There is no abdominal tenderness. There is no guarding.     Comments: Abdomen soft, nondistended, nontender to palpation in all quadrants without guarding or peritoneal signs  Musculoskeletal:        General: No deformity.  Lymphadenopathy:     Cervical: No cervical adenopathy.  Skin:    General: Skin is warm and dry.     Capillary Refill: Capillary  refill takes less than 2 seconds.  Neurological:     Mental Status: She is alert and oriented to person, place, and time.  Psychiatric:        Mood and Affect: Mood normal.        Behavior: Behavior normal.      ED Treatments / Results  Labs (all labs ordered are listed, but only abnormal results are displayed) Labs Reviewed  NOVEL CORONAVIRUS, NAA (HOSP ORDER, SEND-OUT TO REF LAB; TAT 18-24 HRS)  POC SARS CORONAVIRUS 2 AG -  ED    EKG None  Radiology Dg Chest Port 1 View  Result Date: 04/29/2019 CLINICAL DATA:  Cough for 3 days EXAM: PORTABLE CHEST 1 VIEW COMPARISON:  July 18, 2007 FINDINGS: The lungs are clear. The heart size and pulmonary vascularity are normal. No adenopathy. No bone lesions. IMPRESSION: No edema or consolidation.  No adenopathy. Electronically Signed   By: Lowella Grip III M.D.   On: 04/29/2019 08:37    Procedures Procedures (including critical care time)  Medications Ordered in ED Medications  albuterol (VENTOLIN HFA) 108 (90 Base) MCG/ACT inhaler 4 puff (4 puffs Inhalation Given 04/29/19 0818)     Initial Impression / Assessment and Plan / ED Course  I have reviewed the triage vital signs and the nursing notes.  Pertinent labs & imaging results that were available during my care of the patient were reviewed by me and considered in my medical decision making (see chart for details).  Pt presents with nasal congestion and cough.  She denies associated fevers or myalgias.  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  Pt is well appearing and vitals are normal. Lungs CTA on exam. Pt CXR negative for acute infiltrate. Patients symptoms are consistent with URI, likely viral etiology.  Discussed with patient that she should quarantine at home until she see receives the results of her send out Covid test.  Discussed that antibiotics  are not indicated for viral infections. Pt will be discharged with symptomatic treatment.  Verbalizes understanding and is agreeable with plan. Pt is hemodynamically stable & in NAD prior to dc. Return precautions discussed, pt expresses understanding and agrees with plan.   Christy Goodman was evaluated in Emergency Department on 04/29/2019 for the symptoms described in the history of present illness. She was evaluated in the context of the global COVID-19 pandemic, which necessitated consideration that the patient might be at risk for infection with the SARS-CoV-2 virus that causes COVID-19. Institutional protocols and algorithms that pertain to the evaluation of patients at risk for COVID-19 are in a state of rapid change based on information released by regulatory bodies including the CDC and federal and state organizations. These policies and algorithms were followed during the patient's care in the ED.  Final Clinical Impressions(s) / ED Diagnoses   Final diagnoses:  Viral URI with cough  Encounter for laboratory testing for COVID-19 virus    ED Discharge Orders    None       Jacqlyn Larsen, [place] 04/29/19 [id number]    Fredia Sorrow, MD 04/30/19 [phone]

## 2019-04-29 NOTE — ED Triage Notes (Signed)
PT reports productive clear sputum cough with no fever x3 days with nasal congestion. PT states she works at [employer] and they want her to have the covid testing done.

## 2019-04-29 NOTE — Discharge Instructions (Signed)
You have a Covid test pending and you should quarantine at home until you receive your results, these should be back in about 2 to 3 days, you will receive a phone call if results are positive, you can view negative results online through [facility] [facility].  Please follow the instructions on your paperwork today to set up your MyChart account.  Your symptoms are likely caused by a viral upper respiratory infection. Antibiotics are not helpful in treating viral infection, the virus should run its course in about 7-10 days. Please make sure you are drinking plenty of fluids. You can treat your symptoms supportively with tylenol/ibuprofen for fevers and pains, Zyrtec and Flonase to help with nasal congestion, and over the counter cough syrups and throat lozenges to help with cough. Use albuterol inhaler as needed for cough, wheezing or chest tightness. If your symptoms are not improving please follow up with you Primary doctor.   If you develop persistent fevers, shortness of breath or difficulty breathing, chest pain, severe headache and neck pain, persistent nausea and vomiting or other new or concerning symptoms return to the Emergency department.

## 2019-04-30 LAB — NOVEL CORONAVIRUS, NAA (HOSP ORDER, SEND-OUT TO REF LAB; TAT 18-24 HRS): SARS-CoV-2, NAA: NOT DETECTED

## 2019-06-06 ENCOUNTER — Ambulatory Visit: Payer: BC Managed Care – PPO | Attending: Internal Medicine

## 2019-06-06 ENCOUNTER — Other Ambulatory Visit: Payer: Self-pay

## 2019-06-06 DIAGNOSIS — Z20822 Contact with and (suspected) exposure to covid-19: Secondary | ICD-10-CM | POA: Insufficient documentation

## 2019-06-07 LAB — NOVEL CORONAVIRUS, NAA: SARS-CoV-2, NAA: NOT DETECTED

## 2019-06-20 ENCOUNTER — Other Ambulatory Visit: Payer: Self-pay

## 2019-06-20 ENCOUNTER — Ambulatory Visit: Payer: BC Managed Care – PPO | Attending: Internal Medicine

## 2019-06-20 DIAGNOSIS — Z20822 Contact with and (suspected) exposure to covid-19: Secondary | ICD-10-CM

## 2019-06-21 LAB — NOVEL CORONAVIRUS, NAA: SARS-CoV-2, NAA: NOT DETECTED

## 2019-10-10 ENCOUNTER — Emergency Department (HOSPITAL_COMMUNITY)
Admission: EM | Admit: 2019-10-10 | Discharge: 2019-10-10 | Disposition: A | Discharge status: Home / Self Care | Payer: BC Managed Care – PPO | Attending: Emergency Medicine | Admitting: Emergency Medicine

## 2019-10-10 ENCOUNTER — Ambulatory Visit
Admission: EM | Admit: 2019-10-10 | Discharge: 2019-10-10 | Disposition: A | Discharge status: Home / Self Care | Payer: BC Managed Care – PPO | Source: Home / Self Care

## 2019-10-10 ENCOUNTER — Encounter (HOSPITAL_COMMUNITY): Payer: Self-pay | Admitting: *Deleted

## 2019-10-10 ENCOUNTER — Other Ambulatory Visit: Payer: Self-pay

## 2019-10-10 DIAGNOSIS — R112 Nausea with vomiting, unspecified: Secondary | ICD-10-CM | POA: Insufficient documentation

## 2019-10-10 DIAGNOSIS — R42 Dizziness and giddiness: Secondary | ICD-10-CM | POA: Diagnosis not present

## 2019-10-10 DIAGNOSIS — R109 Unspecified abdominal pain: Secondary | ICD-10-CM | POA: Insufficient documentation

## 2019-10-10 LAB — CBC WITH DIFFERENTIAL/PLATELET
Abs Immature Granulocytes: 0.04 10*3/uL (ref 0.00–0.07)
Basophils Absolute: 0.1 10*3/uL (ref 0.0–0.1)
Basophils Relative: 0 %
Eosinophils Absolute: 0 10*3/uL (ref 0.0–0.5)
Eosinophils Relative: 0 %
HCT: 42.6 % (ref 36.0–46.0)
Hemoglobin: 14.1 g/dL (ref 12.0–15.0)
Immature Granulocytes: 0 %
Lymphocytes Relative: 8 %
Lymphs Abs: 1.3 10*3/uL (ref 0.7–4.0)
MCH: 30.2 pg (ref 26.0–34.0)
MCHC: 33.1 g/dL (ref 30.0–36.0)
MCV: 91.2 fL (ref 80.0–100.0)
Monocytes Absolute: 0.4 10*3/uL (ref 0.1–1.0)
Monocytes Relative: 3 %
Neutro Abs: 14.1 10*3/uL — ABNORMAL HIGH (ref 1.7–7.7)
Neutrophils Relative %: 89 %
Platelets: 351 10*3/uL (ref 150–400)
RBC: 4.67 MIL/uL (ref 3.87–5.11)
RDW: 12.7 % (ref 11.5–15.5)
WBC: 15.9 10*3/uL — ABNORMAL HIGH (ref 4.0–10.5)
nRBC: 0 % (ref 0.0–0.2)

## 2019-10-10 LAB — URINALYSIS, ROUTINE W REFLEX MICROSCOPIC
Bacteria, UA: NONE SEEN
Bilirubin Urine: NEGATIVE
Glucose, UA: NEGATIVE mg/dL
Ketones, ur: 5 mg/dL — AB
Leukocytes,Ua: NEGATIVE
Nitrite: NEGATIVE
Protein, ur: NEGATIVE mg/dL
Specific Gravity, Urine: 1.015 (ref 1.005–1.030)
pH: 6 (ref 5.0–8.0)

## 2019-10-10 LAB — COMPREHENSIVE METABOLIC PANEL
ALT: 38 U/L (ref 0–44)
AST: 27 U/L (ref 15–41)
Albumin: 4.6 g/dL (ref 3.5–5.0)
Alkaline Phosphatase: 58 U/L (ref 38–126)
Anion gap: 9 (ref 5–15)
BUN: 12 mg/dL (ref 6–20)
CO2: 26 mmol/L (ref 22–32)
Calcium: 9.4 mg/dL (ref 8.9–10.3)
Chloride: 99 mmol/L (ref 98–111)
Creatinine, Ser: 0.78 mg/dL (ref 0.44–1.00)
GFR calc Af Amer: >60 mL/min (ref >60)
GFR calc non Af Amer: >60 mL/min (ref >60)
Glucose, Bld: 100 mg/dL — ABNORMAL HIGH (ref 70–99)
Potassium: 3.6 mmol/L (ref 3.5–5.1)
Sodium: 134 mmol/L — ABNORMAL LOW (ref 135–145)
Total Bilirubin: 0.2 mg/dL — ABNORMAL LOW (ref 0.3–1.2)
Total Protein: 8.4 g/dL — ABNORMAL HIGH (ref 6.5–8.1)

## 2019-10-10 LAB — LIPASE, BLOOD: Lipase: 30 U/L (ref 11–51)

## 2019-10-10 LAB — PREGNANCY, URINE: Preg Test, Ur: NEGATIVE

## 2019-10-10 MED ORDER — KETOROLAC TROMETHAMINE 30 MG/ML IJ SOLN: 30.0000 mg | Freq: Once | INTRAMUSCULAR | Status: DC

## 2019-10-10 MED ORDER — ONDANSETRON 4 MG PO TBDP
ORAL_TABLET | ORAL | 0 refills | Status: DC
Start: 2019-10-10 — End: 2020-06-08

## 2019-10-10 MED ORDER — SODIUM CHLORIDE 0.9 % IV BOLUS
1000.0000 mL | Freq: Once | INTRAVENOUS | Status: AC
  Administered 2019-10-10: 1000 mL via INTRAVENOUS

## 2019-10-10 MED ORDER — ACETAMINOPHEN 500 MG PO TABS
1000.0000 mg | ORAL_TABLET | Freq: Once | ORAL | Status: AC
  Administered 2019-10-10: 1000 mg via ORAL
  Filled 2019-10-10: qty 2

## 2019-10-10 MED ORDER — ONDANSETRON HCL 4 MG/2ML IJ SOLN
4.0000 mg | Freq: Once | INTRAMUSCULAR | Status: AC
  Administered 2019-10-10: 4 mg via INTRAVENOUS
  Filled 2019-10-10: qty 2

## 2019-10-10 MED ORDER — HYDROXYZINE HCL 25 MG PO TABS
25.0000 mg | ORAL_TABLET | Freq: Three times a day (TID) | ORAL | 0 refills | Status: DC | PRN
Start: 2019-10-10 — End: 2020-06-08

## 2019-10-10 NOTE — ED Triage Notes (Signed)
C/o weakness and vomiting onset today, also states her period started today and she is not usually sick with her period. States she is having cramps

## 2019-10-10 NOTE — ED Provider Notes (Signed)
[facility] EMERGENCY DEPARTMENT Provider Note   CSN: [id number] Arrival date & time: 10/10/19  1405     History Chief Complaint  Patient presents with  . Emesis    Christy Goodman Christy Goodman is a 24 y.o. female.  Christy Goodman is a 24 y.o. female with a history of asthma, seasonal allergies, who presents to the emergency department for evaluation of vomiting and lower abdominal pain.  Patient reports that over the past few weeks she has been feeling weak intermittently, and was seen at urgent care earlier today for an episode of lightheadedness, was encouraged to present to the emergency department but was overall feeling better so went home.  When she went home she began having vomiting and severe lower abdominal cramping.  She reports she started her menstrual cycle yesterday.  When she was younger she used to have severe periods with similar cramping and pain and vomiting but has not had a period like this in a few years.  Reports that she has had a few episodes of vomiting and has not been able to keep anything down.  She reports she is just not feeling well and feels generally weak.  She denies any associated chest pain, shortness of breath or palpitations.  Reports pain goes across her lower abdomen and is cramping in nature.  She reports that she is always had heavy menstrual cycles, and has had typical bleeding during the cycle, denies associated vaginal discharge.  No dysuria or urinary frequency.  No back or flank pain.  She denies any fevers or chills.  Reports she was going to take some Midol but because of vomiting did not think she would be able to keep it down so presented to the ED for further evaluation.  Does not think she could be pregnant.  No other aggravating or alleviating factors.        Past Medical History:  Diagnosis Date  . Asthma   . Gonorrhea 06/06/2016  . H/O seasonal allergies     Patient Active Problem List   Diagnosis Date Noted  . Gonorrhea 06/06/2016  .  Skin ulcer of buttock ([place]), resolved with  hyperpigmentation 04/27/2015  . Herpes genitalis in women 04/12/2015  . Sore throat 10/02/2013  . Sinusitis, acute 10/02/2013    History reviewed. No pertinent surgical history.   OB History    Gravida  0   Para  0   Term  0   Preterm  0   AB  0   Living  0     SAB  0   TAB  0   Ectopic  0   Multiple  0   Live Births  0           Family History  Problem Relation Age of Onset  . Cancer Paternal Grandmother   . Diabetes Paternal Grandmother   . Diabetes Paternal Aunt     Social History   Tobacco Use  . Smoking status: Current Some Day Smoker    Packs/day: 0.25    Years: 1.00    Pack years: 0.25    Types: Cigarettes, Cigars  . Smokeless tobacco: Never Used  Substance Use Topics  . Alcohol use: Yes    Comment: occ  . Drug use: No    Home Medications Prior to Admission medications   Medication Sig Start Date End Date Taking? Authorizing Provider  albuterol (VENTOLIN HFA) 108 (90 Base) MCG/ACT inhaler Inhale 2 puffs into the lungs every 4 (four) hours  as needed. 10/01/19  Yes [provider]  amLODipine (NORVASC) 5 MG tablet Take 5 mg by mouth daily.  09/30/19  Yes [provider]  lisinopril (ZESTRIL) 5 MG tablet Take 5 mg by mouth daily. 10/01/19  Yes [provider]  hydrOXYzine (ATARAX/VISTARIL) 25 MG tablet Take 1 tablet (25 mg total) by mouth every 8 (eight) hours as needed for anxiety. Patient not taking: Reported on 10/10/2019 10/10/19   Lestine Box, PA-C    Allergies    Patient has no known allergies.  Review of Systems   Review of Systems  Constitutional: Positive for fatigue. Negative for chills and fever.  HENT: Negative.   Respiratory: Negative for cough and shortness of breath.   Cardiovascular: Negative for chest pain.  Gastrointestinal: Positive for abdominal pain, nausea and vomiting. Negative for constipation and diarrhea.  Genitourinary: Positive for vaginal  bleeding (On menstrural cycle). Negative for dysuria, frequency and vaginal discharge.  Musculoskeletal: Negative for arthralgias and myalgias.  Skin: Negative for color change and rash.  Neurological: Negative for dizziness, syncope and light-headedness.    Physical Exam Updated Vital Signs BP (!) 159/115 (BP Location: Right Arm)   Pulse 97   Temp 98.3 F (36.8 C) (Oral)   Resp 16   Ht 5\' 3"  (1.6 m)   Wt 100.2 kg   LMP 10/09/2019   SpO2 100%   BMI 39.15 kg/m   Physical Exam Vitals and nursing note reviewed.  Constitutional:      General: She is not in acute distress.    Appearance: Normal appearance. She is well-developed. She is not diaphoretic.     Comments: Well-appearing and in no distress  HENT:     Head: Normocephalic and atraumatic.  Eyes:     General:        Right eye: No discharge.        Left eye: No discharge.     Pupils: Pupils are equal, round, and reactive to light.  Cardiovascular:     Rate and Rhythm: Normal rate and regular rhythm.     Heart sounds: Normal heart sounds.  Pulmonary:     Effort: Pulmonary effort is normal. No respiratory distress.     Breath sounds: Normal breath sounds. No wheezing or rales.     Comments: Respirations equal and unlabored, patient able to speak in full sentences, lungs clear to auscultation bilaterally Abdominal:     General: Bowel sounds are normal. There is no distension.     Palpations: Abdomen is soft. There is no mass.     Tenderness: There is abdominal tenderness. There is no guarding.     Comments: Abdomen is soft, nondistended, bowel sounds present throughout, there is some mild tenderness across lower abdomen, all other quadrants nontender to palpation, no guarding or rebound tenderness  Musculoskeletal:        General: No deformity.     Cervical back: Neck supple.  Skin:    General: Skin is warm and dry.     Capillary Refill: Capillary refill takes less than 2 seconds.  Neurological:     Mental Status: She  is alert.     Coordination: Coordination normal.     Comments: Speech is clear, able to follow commands Moves extremities without ataxia, coordination intact  Psychiatric:        Mood and Affect: Mood normal.        Behavior: Behavior normal.     ED Results / Procedures / Treatments   Labs (all labs  ordered are listed, but only abnormal results are displayed) Labs Reviewed  COMPREHENSIVE METABOLIC PANEL - Abnormal; Notable for the following components:      Result Value   Sodium 134 (*)    Glucose, Bld 100 (*)    Total Protein 8.4 (*)    Total Bilirubin 0.2 (*)    All other components within normal limits  CBC WITH DIFFERENTIAL/PLATELET - Abnormal; Notable for the following components:   WBC 15.9 (*)    Neutro Abs 14.1 (*)    All other components within normal limits  URINALYSIS, ROUTINE W REFLEX MICROSCOPIC - Abnormal; Notable for the following components:   Color, Urine STRAW (*)    Hgb urine dipstick MODERATE (*)    Ketones, ur 5 (*)    All other components within normal limits  LIPASE, BLOOD  PREGNANCY, URINE    EKG None  Radiology No results found.  Procedures Procedures (including critical care time)  Medications Ordered in ED Medications  sodium chloride 0.9 % bolus 1,000 mL (0 mLs Intravenous Stopped 10/10/19 1834)  ondansetron (ZOFRAN) injection 4 mg (4 mg Intravenous Given 10/10/19 1647)  acetaminophen (TYLENOL) tablet 1,000 mg (1,000 mg Oral Given 10/10/19 1648)    ED Course  I have reviewed the triage vital signs and the nursing notes.  Pertinent labs & imaging results that were available during my care of the patient were reviewed by me and considered in my medical decision making (see chart for details).    MDM Rules/Calculators/A&P                     Patient presents to the ED with complaints of abdominal pain associated with her menstrual cycle.  Reports that she used to get very severe.  Cramps and vomiting more regularly but it has been a  while since this happened. Patient nontoxic appearing, in no apparent distress, vitals WNL . On exam patient tender to palpation across the lower abdomen without guarding, no peritoneal signs. Will evaluate with labs, suspect this is due to patient's menstrual cycle, do not think that imaging is indicated at this time.  Patient reports this feels similar to a severe cramp she had with previous menstrual cycles, she has not had any vaginal discharge or new sexual partners, and I have low suspicion for PID.  Analgesics, anti-emetics, and fluids administered.   ER work-up reviewed:  CBC: Leukocytosis of 15.9 with left shift, normal hemoglobin CMP: No significant electrolyte derangements, normal renal and liver function Lipase: WNL UA: Some hematuria noted as expected as pt is on menstrual cycle, no signs of infection Preg test: neg  On repeat abdominal exam patient remains without peritoneal signs, doubt cholecystitis, pancreatitis, diverticulitis, appendicitis, bowel obstruction/perforation,  PID, or ectopic pregnancy. Patient tolerating PO in the emergency department. Will discharge home with supportive measures. I discussed results, treatment plan, need for PCP follow-up, and return precautions with the patient. Provided opportunity for questions, patient confirmed understanding and is in agreement with plan.    Final Clinical Impression(s) / ED Diagnoses Final diagnoses:  Non-intractable vomiting with nausea, unspecified vomiting type  Abdominal cramping    Rx / DC Orders ED Discharge Orders         Ordered    ondansetron (ZOFRAN ODT) 4 MG disintegrating tablet     10/10/19 1759           Jacqlyn Larsen, PA-C 10/12/19 1213    Milton Ferguson, MD 10/13/19 [phone]

## 2019-10-10 NOTE — Discharge Instructions (Signed)
Your work-up today has been very reassuring, lab work looks good.  Use Zofran as needed for nausea and vomiting, and Midol or ibuprofen to help treat pain and menstrual cramps.  Follow-up with your OB/GYN or PCP.

## 2019-10-10 NOTE — ED Provider Notes (Signed)
[facility]   [id number] 10/10/19 Arrival Time: 0804  [id number]  SUBJECTIVE:  Christy Goodman is a 24 y.o. female who presents with complaint of dizziness that began 1 week ago.  Denies trauma, or recent URI within the past month.  Does report brother was killed recently.  Describes the dizziness as, "like i'm going to pass out." States that that episodes are intermittent lasting few minutes at a time.  Was seen at another UC and was started on BP medication.  Has orthostatic VS and blood work done that was all within normal limits per patient.  Symptoms made worse with standing.  Denies having similar symptoms. Complains of HAs, RT ear ache, and SOB at night.  Denies fever, chills, nausea, vomiting, hearing changes, tinnitus, chest pain, syncope, weakness, slurred speech, memory or emotional changes, facial drooping/ asymmetry, incoordination, numbness or tingling, abdominal pain, changes in bowel or bladder habits.    ROS: As per HPI.  All other pertinent ROS negative.    Past Medical History:  Diagnosis Date  . Asthma   . Gonorrhea 06/06/2016  . H/O seasonal allergies    No past surgical history on file. No Known Allergies No current facility-administered medications on file prior to encounter.   Current Outpatient Medications on File Prior to Encounter  Medication Sig Dispense Refill  . amLODipine (NORVASC) 5 MG tablet TAKE ONE (1) TABLET BY MOUTH EVERY DAY    . lisinopril (ZESTRIL) 5 MG tablet Take 5 mg by mouth daily.     Social History   Socioeconomic History  . Marital status: Single    Spouse name: Not on file  . Number of children: Not on file  . Years of education: Not on file  . Highest education level: Not on file  Occupational History  . Not on file  Tobacco Use  . Smoking status: Current Some Day Smoker    Packs/day: 0.25    Years: 1.00    Pack years: 0.25    Types: Cigarettes, Cigars  . Smokeless tobacco: Never Used  Substance and Sexual  Activity  . Alcohol use: Yes    Comment: occ  . Drug use: No  . Sexual activity: Yes    Birth control/protection: None  Other Topics Concern  . Not on file  Social History Narrative  . Not on file   Social Determinants of Health   Financial Resource Strain:   . Difficulty of Paying Living Expenses:   Food Insecurity:   . Worried About Charity fundraiser in the Last Year:   . Arboriculturist in the Last Year:   Transportation Needs:   . Film/video editor (Medical):   Marland Kitchen Lack of Transportation (Non-Medical):   Physical Activity:   . Days of Exercise per Week:   . Minutes of Exercise per Session:   Stress:   . Feeling of Stress :   Social Connections:   . Frequency of Communication with Friends and Family:   . Frequency of Social Gatherings with Friends and Family:   . Attends Religious Services:   . Active Member of Clubs or Organizations:   . Attends Archivist Meetings:   Marland Kitchen Marital Status:   Intimate Partner Violence:   . Fear of Current or Ex-Partner:   . Emotionally Abused:   Marland Kitchen Physically Abused:   . Sexually Abused:    Family History  Problem Relation Age of Onset  . Cancer Paternal Grandmother   . Diabetes Paternal Grandmother   .  Diabetes Paternal Aunt     OBJECTIVE:  Vitals:   10/10/19 0813  BP: 135/86  Pulse: 85  Resp: 16  Temp: 98 F (36.7 C)  SpO2: 98%    Orthostatic VS for the past 24 hrs:  BP- Lying Pulse- Lying BP- Standing at 0 minutes Pulse- Standing at 0 minutes  10/10/19 0834 127/84 84 141/83 88    General appearance: alert; no distress Eyes: PERRLA; EOMI; conjunctiva normal HENT: normocephalic; atraumatic; TMs normal; nasal mucosa normal; oral mucosa normal Neck: supple with FROM Lungs: clear to auscultation bilaterally Heart: regular rate and rhythm Abdomen: soft, non-tender; bowel sounds normal Extremities: no cyanosis or edema; symmetrical with no gross deformities Skin: warm and dry Neurologic: normal gait;  strength and sensation intact about the UEs and LEs; CN 2-12 grossly intact; negative pronator drift; finger to nose without difficulty Psychological: alert and cooperative; normal mood and affect   ASSESSMENT & PLAN:  1. Dizziness and giddiness     Meds ordered this encounter  Medications  . hydrOXYzine (ATARAX/VISTARIL) 25 MG tablet    Sig: Take 1 tablet (25 mg total) by mouth every 8 (eight) hours as needed for anxiety.    Dispense:  15 tablet    Refill:  0    Order Specific Question:   Supervising Provider    Answer:   Raylene Everts [id number]   Unable to rule out cardiac disease or neurological disease in urgent care setting.  Offered patient further evaluation and management in the ED.  Patient declines at this time and would like to try outpatient therapy first.  Aware of the risk associated with this decision including missed diagnosis, organ damage, organ failure, and/or death.  Patient aware and in agreement.     Declines EKG Declines CBG today  Rest and drink fluids Eat a well-balanced diet, and avoid excessive caffeine intake Prescribed hydroxyzine.  Take at bedtime.  AVOID DRIVING OR OPERATING HEAVY MACHINERY as this may make you drowsy Some things you may try doing to help alleviate your symptoms include: keeping a journal, exercise, talking to a friend or relative, listening to music, going for a walk or hike outside, or other activities that you may find enjoyable Recommending further evaluation and management with PCP Return or go to ER if you have any new or worsening symptoms such as fever, chills, fatigue, worsening shortness of breath, wheezing, chest pain, nausea, vomiting, abdominal pain, changes in bowel or bladder habits, etc...   Reviewed expectations re: course of current medical issues. Questions answered. Outlined signs and symptoms indicating need for more acute intervention. Patient verbalized understanding. After Visit Summary given.    Stacey Drain  [place], PA-C 10/10/19 [phone]

## 2019-10-10 NOTE — Discharge Instructions (Signed)
Unable to rule out cardiac disease or neurological disease in urgent care setting.  Offered patient further evaluation and management in the ED.  Patient declines at this time and would like to try outpatient therapy first.  Aware of the risk associated with this decision including missed diagnosis, organ damage, organ failure, and/or death.  Patient aware and in agreement.     Declines EKG Declines CBG today  Rest and drink fluids Eat a well-balanced diet, and avoid excessive caffeine intake Prescribed hydroxyzine.  Take at bedtime.  AVOID DRIVING OR OPERATING HEAVY MACHINERY as this may make you drowsy Some things you may try doing to help alleviate your symptoms include: keeping a journal, exercise, talking to a friend or relative, listening to music, going for a walk or hike outside, or other activities that you may find enjoyable Recommending further evaluation and management with PCP Return or go to ER if you have any new or worsening symptoms such as fever, chills, fatigue, worsening shortness of breath, wheezing, chest pain, nausea, vomiting, abdominal pain, changes in bowel or bladder habits, etc..Marland Kitchen

## 2019-10-10 NOTE — ED Triage Notes (Signed)
Pt c/o episodes of sudden onset dizziness and weakness, "like I'm going to pass out." Was seen at another urgent care last week for the same, was started on BP medications and had another episode last night.

## 2019-12-03 ENCOUNTER — Emergency Department (HOSPITAL_COMMUNITY): Payer: BC Managed Care – PPO

## 2019-12-03 ENCOUNTER — Other Ambulatory Visit: Payer: Self-pay

## 2019-12-03 ENCOUNTER — Encounter (HOSPITAL_COMMUNITY): Payer: Self-pay | Admitting: *Deleted

## 2019-12-03 ENCOUNTER — Emergency Department (HOSPITAL_COMMUNITY)
Admission: EM | Admit: 2019-12-03 | Discharge: 2019-12-03 | Disposition: A | Discharge status: Home / Self Care | Payer: BC Managed Care – PPO | Attending: Emergency Medicine | Admitting: Emergency Medicine

## 2019-12-03 DIAGNOSIS — R002 Palpitations: Secondary | ICD-10-CM | POA: Insufficient documentation

## 2019-12-03 DIAGNOSIS — G8929 Other chronic pain: Secondary | ICD-10-CM

## 2019-12-03 DIAGNOSIS — F17290 Nicotine dependence, other tobacco product, uncomplicated: Secondary | ICD-10-CM | POA: Insufficient documentation

## 2019-12-03 DIAGNOSIS — G44229 Chronic tension-type headache, not intractable: Secondary | ICD-10-CM | POA: Diagnosis not present

## 2019-12-03 DIAGNOSIS — R519 Headache, unspecified: Secondary | ICD-10-CM | POA: Diagnosis present

## 2019-12-03 DIAGNOSIS — R42 Dizziness and giddiness: Secondary | ICD-10-CM

## 2019-12-03 DIAGNOSIS — J45909 Unspecified asthma, uncomplicated: Secondary | ICD-10-CM | POA: Diagnosis not present

## 2019-12-03 LAB — CBC WITH DIFFERENTIAL/PLATELET
Abs Immature Granulocytes: 0.03 10*3/uL (ref 0.00–0.07)
Basophils Absolute: 0.1 10*3/uL (ref 0.0–0.1)
Basophils Relative: 1 %
Eosinophils Absolute: 0.1 10*3/uL (ref 0.0–0.5)
Eosinophils Relative: 1 %
HCT: 38 % (ref 36.0–46.0)
Hemoglobin: 12.5 g/dL (ref 12.0–15.0)
Immature Granulocytes: 0 %
Lymphocytes Relative: 25 %
Lymphs Abs: 2.8 10*3/uL (ref 0.7–4.0)
MCH: 30.3 pg (ref 26.0–34.0)
MCHC: 32.9 g/dL (ref 30.0–36.0)
MCV: 92 fL (ref 80.0–100.0)
Monocytes Absolute: 0.5 10*3/uL (ref 0.1–1.0)
Monocytes Relative: 4 %
Neutro Abs: 7.9 10*3/uL — ABNORMAL HIGH (ref 1.7–7.7)
Neutrophils Relative %: 69 %
Platelets: 307 10*3/uL (ref 150–400)
RBC: 4.13 MIL/uL (ref 3.87–5.11)
RDW: 13.2 % (ref 11.5–15.5)
WBC: 11.4 10*3/uL — ABNORMAL HIGH (ref 4.0–10.5)
nRBC: 0 % (ref 0.0–0.2)

## 2019-12-03 LAB — BASIC METABOLIC PANEL
Anion gap: 9 (ref 5–15)
BUN: 12 mg/dL (ref 6–20)
CO2: 25 mmol/L (ref 22–32)
Calcium: 9 mg/dL (ref 8.9–10.3)
Chloride: 103 mmol/L (ref 98–111)
Creatinine, Ser: 0.8 mg/dL (ref 0.44–1.00)
GFR calc Af Amer: >60 mL/min (ref >60)
GFR calc non Af Amer: >60 mL/min (ref >60)
Glucose, Bld: 86 mg/dL (ref 70–99)
Potassium: 3.8 mmol/L (ref 3.5–5.1)
Sodium: 137 mmol/L (ref 135–145)

## 2019-12-03 LAB — TROPONIN I (HIGH SENSITIVITY)
Troponin I (High Sensitivity): <2 ng/L (ref <18)
Troponin I (High Sensitivity): <2 ng/L (ref <18)

## 2019-12-03 LAB — I-STAT BETA HCG BLOOD, ED (MC, WL, AP ONLY): I-stat hCG, quantitative: <5 m[IU]/mL (ref <5)

## 2019-12-03 MED ORDER — PROCHLORPERAZINE MALEATE 5 MG PO TABS
10.0000 mg | ORAL_TABLET | Freq: Once | ORAL | Status: AC
  Administered 2019-12-03: 10 mg via ORAL
  Filled 2019-12-03: qty 2

## 2019-12-03 MED ORDER — IBUPROFEN 400 MG PO TABS
600.0000 mg | ORAL_TABLET | Freq: Once | ORAL | Status: AC
  Administered 2019-12-03: 600 mg via ORAL
  Filled 2019-12-03: qty 2

## 2019-12-03 NOTE — Discharge Instructions (Addendum)
Your evaluation tonight is reassuring, your lab tests x-rays and EKG are stable.  As discussed, it is possible your blood pressure is getting too low with the 2 blood pressure medications that you were started at the same time, you may need to just take 1 or the other medication to see if this improves your symptoms.  Please discuss this with your primary doctor, call for an appointment within the next several days for recheck of your symptoms.

## 2019-12-03 NOTE — ED Triage Notes (Signed)
Pt states she was sent here from PCP, c/o HA's and palpitations at times.  Feels like she will pass out with standing for long periods.

## 2019-12-03 NOTE — ED Provider Notes (Signed)
Patient was signed out to me at shift change by Deno Etienne, PA-C.  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 She denies that this is exertional activity but does have to stand on her feet for long periods of time. She was started on lisinopril and amlodipine 3 months ago on the same day for hypertension. 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 She takes her blood pressure medication prior to going to work.  Patient's labs and imaging, EKG were reviewed and discussed with patient. Her evaluation here is reassuring. We did discuss the possibility that her blood pressure may be getting too low with both of these medications which could explain these episodes of lightheadedness. She was asked to discuss this with her PCP whom she plans to see within the next 48 hours.  The patient appears reasonably screened and/or stabilized for discharge and I doubt any other medical condition or other [facility] requiring further screening, evaluation, or treatment in the ED at this time prior to discharge.   Results for orders placed or performed during the hospital encounter of 16/96/78  Basic metabolic panel  Result Value Ref Range   Sodium 137 135 - 145 mmol/L   Potassium 3.8 3.5 - 5.1 mmol/L   Chloride 103 98 - 111 mmol/L   CO2 25 22 - 32 mmol/L   Glucose, Bld 86 70 - 99 mg/dL   BUN 12 6 - 20 mg/dL   Creatinine, Ser 0.80 0.44 - 1.00 mg/dL   Calcium 9.0 8.9 - 10.3 mg/dL   GFR calc non Af Amer >60 >60 mL/min   GFR calc Af Amer >60 >60 mL/min   Anion gap 9 5 - 15  CBC with Differential  Result Value Ref Range   WBC 11.4 (H) 4.0 - 10.5 K/uL   RBC 4.13 3.87 - 5.11 MIL/uL   Hemoglobin 12.5 12.0 - 15.0 g/dL   HCT 38.0 36 - 46 %   MCV 92.0 80.0 - 100.0 fL   MCH 30.3 26.0 - 34.0 pg   MCHC 32.9 30.0 - 36.0 g/dL   RDW 13.2 11.5  - 15.5 %   Platelets 307 150 - 400 K/uL   nRBC 0.0 0.0 - 0.2 %   Neutrophils Relative % 69 %   Neutro Abs 7.9 (H) 1.7 - 7.7 K/uL   Lymphocytes Relative 25 %   Lymphs Abs 2.8 0.7 - 4.0 K/uL   Monocytes Relative 4 %   Monocytes Absolute 0.5 0 - 1 K/uL   Eosinophils Relative 1 %   Eosinophils Absolute 0.1 0 - 0 K/uL   Basophils Relative 1 %   Basophils Absolute 0.1 0 - 0 K/uL   Immature Granulocytes 0 %   Abs Immature Granulocytes 0.03 0.00 - 0.07 K/uL  I-Stat beta hCG blood, ED  Result Value Ref Range   I-stat hCG, quantitative <5.0 <5 mIU/mL   Comment 3          Troponin I (High Sensitivity)  Result Value Ref Range   Troponin I (High Sensitivity) <2 <18 ng/L  Troponin I (High Sensitivity)  Result Value Ref Range   Troponin I (High Sensitivity) <2 <18 ng/L   DG Chest Port 1 View  Result Date: 12/03/2019 CLINICAL DATA:  Palpitations for 3 weeks. EXAM: PORTABLE CHEST 1 VIEW COMPARISON:  04/29/2019 FINDINGS: The cardiomediastinal contours are normal. The  lungs are clear. Pulmonary vasculature is normal. No consolidation, pleural effusion, or pneumothorax. No acute osseous abnormalities are seen. IMPRESSION: Negative portable AP view of the chest. Electronically Signed   By: Keith Rake M.D.   On: 12/03/2019 18:31      Landis Martins 12/03/19 2240    Hayden Rasmussen, MD 12/04/19 [phone]

## 2019-12-03 NOTE — ED Provider Notes (Signed)
[facility] Provider Note   CSN: [id number] Arrival date & time: 12/03/19  1416     History Chief Complaint  Patient presents with  . Headache    Christy Goodman is a 24 y.o. female.  HPI   Patient presents to the emergency department with chief complaint of near syncopal episode and a headache.  Patient explains over the last 3 weeks she has felt like she had to pass out but never passed out.  Explains this happens when she is at work and is standing for long periods of time.  She denies any alleviating or aggravating factors, denies becoming diaphoretic, chest pain, nausea or vomiting.  She recently was placed on hypertension medication but has been followed closely with her primary care provider.  sHe also admits that she has had a headache for the last 3 week. Which has been constant describes them as being dull, pain is in the center of her head.  She denies associated photophobia, nausea, vomiting, paresthesia, weakness in the extremities, unbalanced.  Patient denies recent falls, head trauma, fever, chills, sore throat, cough, chest pain, shortness of breath, abdominal pain, pedal edema.  Past Medical History:  Diagnosis Date  . Asthma   . Gonorrhea 06/06/2016  . H/O seasonal allergies     Patient Active Problem List   Diagnosis Date Noted  . Gonorrhea 06/06/2016  . Skin ulcer of buttock ([place]), resolved with  hyperpigmentation 04/27/2015  . Herpes genitalis in women 04/12/2015  . Sore throat 10/02/2013  . Sinusitis, acute 10/02/2013    History reviewed. No pertinent surgical history.   OB History    Gravida  0   Para  0   Term  0   Preterm  0   AB  0   Living  0     SAB  0   TAB  0   Ectopic  0   Multiple  0   Live Births  0           Family History  Problem Relation Age of Onset  . Cancer Paternal Grandmother   . Diabetes Paternal Grandmother   . Diabetes Paternal Aunt     Social History   Tobacco Use  . Smoking  status: Current Some Day Smoker    Packs/day: 0.25    Years: 1.00    Pack years: 0.25    Types: Cigarettes, Cigars  . Smokeless tobacco: Never Used  Vaping Use  . Vaping Use: Never used  Substance Use Topics  . Alcohol use: Yes    Comment: occ  . Drug use: No    Home Medications Prior to Admission medications   Medication Sig Start Date End Date Taking? Authorizing Provider  albuterol (VENTOLIN HFA) 108 (90 Base) MCG/ACT inhaler Inhale 2 puffs into the lungs every 4 (four) hours as needed for wheezing or shortness of breath.  10/01/19  Yes [provider]  amLODipine (NORVASC) 5 MG tablet Take 5 mg by mouth daily.  09/30/19  Yes [provider]  hydrOXYzine (ATARAX/VISTARIL) 25 MG tablet Take 1 tablet (25 mg total) by mouth every 8 (eight) hours as needed for anxiety. Patient taking differently: Take 25 mg by mouth daily as needed for anxiety.  10/10/19  Yes Wurst, [place], PA-C  ibuprofen (ADVIL) 200 MG tablet Take 600-800 mg by mouth daily as needed for headache or moderate pain.   Yes [provider]  lisinopril (ZESTRIL) 5 MG tablet Take 5 mg by mouth daily.  10/01/19  Yes [provider]  ondansetron (ZOFRAN ODT) 4 MG disintegrating tablet 4mg  ODT q4 hours prn nausea/vomit Patient not taking: Reported on 12/03/2019 10/10/19   Jacqlyn Larsen, PA-C    Allergies    Patient has no known allergies.  Review of Systems   Review of Systems  Constitutional: Negative for chills and fever.  HENT: Negative for congestion, sore throat and tinnitus.   Eyes: Negative for photophobia, pain, redness and visual disturbance.  Respiratory: Negative for cough and shortness of breath.   Cardiovascular: Positive for palpitations. Negative for chest pain and leg swelling.  Gastrointestinal: Negative for abdominal pain, diarrhea, nausea and vomiting.  Genitourinary: Negative for enuresis.  Musculoskeletal: Negative for back pain.       Admits to left middle finger  nailbed injury  Skin: Negative for rash.  Neurological: Positive for syncope and headaches. Negative for dizziness, tremors, weakness and numbness.  Hematological: Does not bruise/bleed easily.    Physical Exam Updated Vital Signs BP 120/67   Pulse 84   Temp 98.4 F (36.9 C) (Oral)   Resp 20   Ht 5\' 3"  (1.6 m)   Wt 99.8 kg   LMP  (LMP Unknown)   SpO2 100%   BMI 38.97 kg/m   Physical Exam Vitals and nursing note reviewed.  Constitutional:      General: She is not in acute distress.    Appearance: Normal appearance. She is not ill-appearing or diaphoretic.  HENT:     Head: Normocephalic and atraumatic.     Nose: No congestion or rhinorrhea.     Mouth/Throat:     Mouth: Mucous membranes are moist.     Pharynx: Oropharynx is clear.  Eyes:     General: No visual field deficit or scleral icterus.       Right eye: No discharge.        Left eye: No discharge.     Extraocular Movements: Extraocular movements intact.     Conjunctiva/sclera: Conjunctivae normal.     Pupils: Pupils are equal, round, and reactive to light.  Cardiovascular:     Rate and Rhythm: Normal rate and regular rhythm.     Pulses: Normal pulses.     Heart sounds: No murmur heard.  No friction rub. No gallop.   Pulmonary:     Effort: Pulmonary effort is normal. No respiratory distress.     Breath sounds: Normal breath sounds. No wheezing, rhonchi or rales.  Abdominal:     General: There is no distension.     Palpations: Abdomen is soft.     Tenderness: There is no abdominal tenderness. There is no guarding.  Musculoskeletal:        General: No swelling or tenderness.     Cervical back: Neck supple.     Right lower leg: No edema.     Left lower leg: No edema.  Skin:    General: Skin is warm and dry.     Capillary Refill: Capillary refill takes less than 2 seconds.     Coloration: Skin is not jaundiced or pale.     Findings: No rash.  Neurological:     General: No focal deficit present.     Mental  Status: She is alert and oriented to person, place, and time.     GCS: GCS eye subscore is 4. GCS verbal subscore is 5. GCS motor subscore is 6.     Cranial Nerves: Cranial nerves are intact. No cranial nerve deficit or facial  asymmetry.     Sensory: Sensation is intact. No sensory deficit.     Motor: Motor function is intact. No weakness or pronator drift.     Coordination: Coordination is intact. Romberg sign negative. Finger-Nose-Finger Test and Heel to [facility] Test normal.  Psychiatric:        Mood and Affect: Mood normal.     ED Results / Procedures / Treatments   Labs (all labs ordered are listed, but only abnormal results are displayed) Labs Reviewed  CBC WITH DIFFERENTIAL/PLATELET - Abnormal; Notable for the following components:      Result Value   WBC 11.4 (*)    Neutro Abs 7.9 (*)    All other components within normal limits  BASIC METABOLIC PANEL  I-STAT BETA HCG BLOOD, ED ([facility], [facility], [facility] ONLY)  TROPONIN I (HIGH SENSITIVITY)  TROPONIN I (HIGH SENSITIVITY)    EKG EKG Interpretation  Date/Time:  Wednesday December 03 2019 14:31:19 EDT Ventricular Rate:  88 PR Interval:  166 QRS Duration: 74 QT Interval:  350 QTC Calculation: 423 R Axis:   14 Text Interpretation: Normal sinus rhythm Normal ECG No old tracing to compare Confirmed by Aletta Edouard [phone]) on 12/03/2019 4:57:44 PM   Radiology DG Chest Port 1 View  Result Date: 12/03/2019 CLINICAL DATA:  Palpitations for 3 weeks. EXAM: PORTABLE CHEST 1 VIEW COMPARISON:  04/29/2019 FINDINGS: The cardiomediastinal contours are normal. The lungs are clear. Pulmonary vasculature is normal. No consolidation, pleural effusion, or pneumothorax. No acute osseous abnormalities are seen. IMPRESSION: Negative portable AP view of the chest. Electronically Signed   By: Keith Rake M.D.   On: 12/03/2019 18:31    Procedures Procedures (including critical care time)  Medications Ordered in ED Medications  prochlorperazine (COMPAZINE)  tablet 10 mg (10 mg Oral Given 12/03/19 1900)  ibuprofen (ADVIL) tablet 600 mg (600 mg Oral Given 12/03/19 1901)    ED Course  I have reviewed the triage vital signs and the nursing notes.  Pertinent labs & imaging results that were available during my care of the patient were reviewed by me and considered in my medical decision making (see chart for details).    MDM Rules/Calculators/A&P                          I have personally reviewed all imaging, labs and have interpreted them.  Due to patient's complaint most concern for systemic infection, metabolic abnormality, cardiac abnormality, CVA.  Unlikely patient suffering from CVA as neuro exam was benign, no deficits noted, patient denies change in vision, no change in balance, further imaging is not indicated.  Patient was given ibuprofen and Compazine for headache.  Unlikely patient is suffering from systemic infection as patient is nontoxic-appearing, vital signs reassuring, lungs were clear bilaterally, physical exam was benign, x-ray did not show any acute abnormalities.  Unlikely patient suffering from a cardiac abnormality as chest pain is nonexertional, episodic, denies diaphoresis, shortness of breath, non radiating pain EKG was sinus rhythm without signs of ischemia.  Unlikely patient suffering from a PE as she denies leg pain, leg swelling, low risk factors, no recent surgeries, not on hormone therapy, denies long immobilization, patient is afebrile, nontachycardic, nontachypneic satting at 100 on room air.  Patient will be handed over to [facility] due to shift change.  She was given HPI and current plan for patient.  Differential diagnosis for patient is anxiety versus panic attack versus migraines.  Patient will likely  be discharged pending labs(troponin, BNP, CBC) and recommend she follows up with her primary care doctor for further evaluation and management.  Final Clinical Impression(s) / ED Diagnoses Final diagnoses:  Chronic  nonintractable headache, unspecified headache type  Palpitations    Rx / DC Orders ED Discharge Orders    None       Marcello Fennel, PA-C 12/03/19 1931    Hayden Rasmussen, MD 12/04/19 1119

## 2019-12-03 NOTE — ED Notes (Signed)
States she doesn't really know what is wrong with her, states she was referred here by her PCP. States she is having difficulty working 10 hours shifts because she becomes lightheaded and feels like she is going to pass out.

## 2019-12-29 ENCOUNTER — Emergency Department (HOSPITAL_COMMUNITY)
Admission: EM | Admit: 2019-12-29 | Discharge: 2019-12-29 | Disposition: A | Discharge status: Home / Self Care | Payer: BC Managed Care – PPO | Attending: Emergency Medicine | Admitting: Emergency Medicine

## 2019-12-29 ENCOUNTER — Encounter (HOSPITAL_COMMUNITY): Payer: Self-pay | Admitting: *Deleted

## 2019-12-29 ENCOUNTER — Emergency Department (HOSPITAL_COMMUNITY): Payer: BC Managed Care – PPO

## 2019-12-29 ENCOUNTER — Other Ambulatory Visit: Payer: Self-pay

## 2019-12-29 DIAGNOSIS — R0602 Shortness of breath: Secondary | ICD-10-CM | POA: Diagnosis not present

## 2019-12-29 DIAGNOSIS — R079 Chest pain, unspecified: Secondary | ICD-10-CM

## 2019-12-29 DIAGNOSIS — Z20822 Contact with and (suspected) exposure to covid-19: Secondary | ICD-10-CM | POA: Diagnosis not present

## 2019-12-29 DIAGNOSIS — R519 Headache, unspecified: Secondary | ICD-10-CM | POA: Insufficient documentation

## 2019-12-29 DIAGNOSIS — F17290 Nicotine dependence, other tobacco product, uncomplicated: Secondary | ICD-10-CM | POA: Insufficient documentation

## 2019-12-29 DIAGNOSIS — J45909 Unspecified asthma, uncomplicated: Secondary | ICD-10-CM | POA: Insufficient documentation

## 2019-12-29 DIAGNOSIS — R05 Cough: Secondary | ICD-10-CM | POA: Diagnosis not present

## 2019-12-29 DIAGNOSIS — R0789 Other chest pain: Secondary | ICD-10-CM | POA: Diagnosis not present

## 2019-12-29 DIAGNOSIS — Z79899 Other long term (current) drug therapy: Secondary | ICD-10-CM | POA: Diagnosis not present

## 2019-12-29 LAB — CBC
HCT: 39.3 % (ref 36.0–46.0)
Hemoglobin: 12.8 g/dL (ref 12.0–15.0)
MCH: 29.8 pg (ref 26.0–34.0)
MCHC: 32.6 g/dL (ref 30.0–36.0)
MCV: 91.4 fL (ref 80.0–100.0)
Platelets: 334 10*3/uL (ref 150–400)
RBC: 4.3 MIL/uL (ref 3.87–5.11)
RDW: 13.3 % (ref 11.5–15.5)
WBC: 11.1 10*3/uL — ABNORMAL HIGH (ref 4.0–10.5)
nRBC: 0 % (ref 0.0–0.2)

## 2019-12-29 LAB — BASIC METABOLIC PANEL
Anion gap: 9 (ref 5–15)
BUN: 17 mg/dL (ref 6–20)
CO2: 25 mmol/L (ref 22–32)
Calcium: 9.4 mg/dL (ref 8.9–10.3)
Chloride: 101 mmol/L (ref 98–111)
Creatinine, Ser: 0.92 mg/dL (ref 0.44–1.00)
GFR calc Af Amer: >60 mL/min (ref >60)
GFR calc non Af Amer: >60 mL/min (ref >60)
Glucose, Bld: 90 mg/dL (ref 70–99)
Potassium: 3.9 mmol/L (ref 3.5–5.1)
Sodium: 135 mmol/L (ref 135–145)

## 2019-12-29 LAB — TROPONIN I (HIGH SENSITIVITY)
Troponin I (High Sensitivity): <2 ng/L (ref <18)
Troponin I (High Sensitivity): <2 ng/L (ref <18)

## 2019-12-29 LAB — SARS CORONAVIRUS 2 BY RT PCR (HOSPITAL ORDER, PERFORMED IN ~~LOC~~ HOSPITAL LAB): SARS Coronavirus 2: NEGATIVE

## 2019-12-29 LAB — POCT PREGNANCY, URINE: Preg Test, Ur: NEGATIVE

## 2019-12-29 MED ORDER — SODIUM CHLORIDE 0.9% FLUSH: 3.0000 mL | Freq: Once | INTRAVENOUS | Status: DC

## 2019-12-29 NOTE — ED Triage Notes (Signed)
Headache for over a week, also has chest pain and request a covid test

## 2019-12-29 NOTE — Discharge Instructions (Addendum)
Please get a follow-up appointment with your primary doctor regarding the symptoms you are experiencing today.  If you develop worsening difficulty breathing, chest pain, fever, vomiting or other new concerning symptom, please return to ER for reassessment.  Recommend Tylenol or Motrin as needed for headaches.

## 2019-12-29 NOTE — ED Provider Notes (Signed)
[facility] EMERGENCY DEPARTMENT Provider Note   CSN: [id number] Arrival date & time: 12/29/19  1135     History Chief Complaint  Patient presents with  . Headache  . Chest Pain    Christy Goodman is a 24 y.o. female.  Presents to ER with concern for chest pain, shortness of breath, headache.  Patient reports that she has had all of these symptoms previously.  Regarding headache, intermittent, not sudden onset, occurs mostly in the mornings, evenings and after work.  Dull, achy, moderate to severe.  Not associated with numbness, weakness, vision changes.  Improved with Goody powder.  Reports that she also has been having chest pain and shortness of breath, states that the symptoms also occur more frequently in the morning and at night before going to bed.  Not associated with exertion, up to 8 out of 10 in severity, dull, achy.  Worse on left side.  HPI     Past Medical History:  Diagnosis Date  . Asthma   . Gonorrhea 06/06/2016  . H/O seasonal allergies     Patient Active Problem List   Diagnosis Date Noted  . Gonorrhea 06/06/2016  . Skin ulcer of buttock ([place]), resolved with  hyperpigmentation 04/27/2015  . Herpes genitalis in women 04/12/2015  . Sore throat 10/02/2013  . Sinusitis, acute 10/02/2013    History reviewed. No pertinent surgical history.   OB History    Gravida  0   Para  0   Term  0   Preterm  0   AB  0   Living  0     SAB  0   TAB  0   Ectopic  0   Multiple  0   Live Births  0           Family History  Problem Relation Age of Onset  . Cancer Paternal Grandmother   . Diabetes Paternal Grandmother   . Diabetes Paternal Aunt     Social History   Tobacco Use  . Smoking status: Current Some Day Smoker    Packs/day: 0.25    Years: 1.00    Pack years: 0.25    Types: Cigarettes, Cigars  . Smokeless tobacco: Never Used  Vaping Use  . Vaping Use: Never used  Substance Use Topics  . Alcohol use: Yes    Comment: occ  . Drug  use: No    Home Medications Prior to Admission medications   Medication Sig Start Date End Date Taking? Authorizing Provider  albuterol (VENTOLIN HFA) 108 (90 Base) MCG/ACT inhaler Inhale 2 puffs into the lungs every 4 (four) hours as needed for wheezing or shortness of breath.  10/01/19   [provider]  amLODipine (NORVASC) 5 MG tablet Take 5 mg by mouth daily.  09/30/19   [provider]  hydrOXYzine (ATARAX/VISTARIL) 25 MG tablet Take 1 tablet (25 mg total) by mouth every 8 (eight) hours as needed for anxiety. Patient taking differently: Take 25 mg by mouth daily as needed for anxiety.  10/10/19   Wurst, [place], PA-C  ibuprofen (ADVIL) 200 MG tablet Take 600-800 mg by mouth daily as needed for headache or moderate pain.    [provider]  lisinopril (ZESTRIL) 5 MG tablet Take 5 mg by mouth daily. 10/01/19   [provider]  ondansetron (ZOFRAN ODT) 4 MG disintegrating tablet 4mg  ODT q4 hours prn nausea/vomit Patient not taking: Reported on 12/03/2019 10/10/19   Jacqlyn Larsen, PA-C    Allergies  Patient has no known allergies.  Review of Systems   Review of Systems  Constitutional: Negative for chills and fever.  HENT: Negative for ear pain and sore throat.   Eyes: Negative for pain and visual disturbance.  Respiratory: Positive for shortness of breath. Negative for cough.   Cardiovascular: Positive for chest pain. Negative for palpitations.  Gastrointestinal: Negative for abdominal pain and vomiting.  Genitourinary: Negative for dysuria and hematuria.  Musculoskeletal: Negative for arthralgias and back pain.  Skin: Negative for color change and rash.  Neurological: Negative for seizures and syncope.  All other systems reviewed and are negative.   Physical Exam Updated Vital Signs BP 109/78 (BP Location: Right Arm)   Pulse 89   Temp 98.2 F (36.8 C) (Oral)   Resp 16   Ht 5\' 3"  (1.6 m)   Wt 102.1 kg   LMP 11/28/2019 Comment: irregular  periods  SpO2 100%   BMI 39.86 kg/m   Physical Exam Vitals and nursing note reviewed.  Constitutional:      General: She is not in acute distress.    Appearance: She is well-developed.  HENT:     Head: Normocephalic and atraumatic.  Eyes:     Conjunctiva/sclera: Conjunctivae normal.  Cardiovascular:     Rate and Rhythm: Normal rate and regular rhythm.     Heart sounds: No murmur heard.   Pulmonary:     Effort: Pulmonary effort is normal. No respiratory distress.     Breath sounds: Normal breath sounds.  Abdominal:     Palpations: Abdomen is soft.     Tenderness: There is no abdominal tenderness.  Musculoskeletal:     Cervical back: Neck supple.  Skin:    General: Skin is warm and dry.  Neurological:     Mental Status: She is alert.  Psychiatric:        Mood and Affect: Mood normal.        Behavior: Behavior normal.     ED Results / Procedures / Treatments   Labs (all labs ordered are listed, but only abnormal results are displayed) Labs Reviewed  CBC - Abnormal; Notable for the following components:      Result Value   WBC 11.1 (*)    All other components within normal limits  SARS CORONAVIRUS 2 BY RT PCR (HOSPITAL ORDER, [facility])  BASIC METABOLIC PANEL  POC URINE PREG, ED  POCT PREGNANCY, URINE  TROPONIN I (HIGH SENSITIVITY)  TROPONIN I (HIGH SENSITIVITY)    EKG EKG Interpretation  Date/Time:  Monday December 29 2019 11:51:04 EDT Ventricular Rate:  84 PR Interval:  176 QRS Duration: 78 QT Interval:  366 QTC Calculation: 432 R Axis:   37 Text Interpretation: Normal sinus rhythm Normal ECG No significant change since prior 7/21 Confirmed by Aletta Edouard [phone]) on 12/29/2019 12:05:34 PM   Radiology DG Chest 2 View  Result Date: 12/29/2019 CLINICAL DATA:  Chest pain EXAM: CHEST - 2 VIEW COMPARISON:  12/03/2019 FINDINGS: The cardiomediastinal silhouette is normal in contour. No pleural effusion. No pneumothorax. No acute  pleuroparenchymal abnormality. Visualized abdomen is unremarkable. No acute osseous abnormality noted. IMPRESSION: No acute cardiopulmonary abnormality. Electronically Signed   By: Valentino Saxon MD   On: 12/29/2019 13:25    Procedures Procedures (including critical care time)  Medications Ordered in ED Medications - No data to display  ED Course  I have reviewed the triage vital signs and the nursing notes.  Pertinent labs & imaging results that  were available during my care of the patient were reviewed by me and considered in my medical decision making (see chart for details).    MDM Rules/Calculators/A&P                          24 year old presents to ER with concern for cough, chest pain, headache.  Has history of similar symptoms previously, today she is well-appearing.  Regarding headache, worsening of her life, not sudden onset, no associated neurologic symptoms or deficits.  Suspect tension type headache.  Regarding chest pain, she is well-appearing, vital stable, EKG without ischemic change, troponin within normal limits, doubt ACS.  CXR negative.  Suspect patient may have viral illness contributing to symptoms today.  Will send for COVID-19.  Recommend recheck with primary doctor.  Reviewed return precautions and discharged home.    After the discussed management above, the patient was determined to be safe for discharge.  The patient was in agreement with this plan and all questions regarding their care were answered.  ED return precautions were discussed and the patient will return to the ED with any significant worsening of condition.    Final Clinical Impression(s) / ED Diagnoses Final diagnoses:  Chest pain, unspecified type    Rx / DC Orders ED Discharge Orders    None       Lucrezia Starch, MD 12/30/19 1606

## 2020-04-28 ENCOUNTER — Other Ambulatory Visit: Payer: Self-pay

## 2020-04-28 ENCOUNTER — Encounter (HOSPITAL_COMMUNITY): Payer: Self-pay | Admitting: Physical Therapy

## 2020-04-28 ENCOUNTER — Ambulatory Visit (HOSPITAL_COMMUNITY): Payer: BC Managed Care – PPO | Attending: Otolaryngology | Admitting: Physical Therapy

## 2020-04-28 DIAGNOSIS — R42 Dizziness and giddiness: Secondary | ICD-10-CM | POA: Diagnosis not present

## 2020-04-28 NOTE — Therapy (Signed)
[place], [place], [address] Phone: [phone]   Fax:  [phone]  Physical Therapy Evaluation  Patient Details  Name: Alexsis Kathman Hing MRN: [id number] Date of Birth: [birth year] Referring Provider (PT): Kasandra Knudsen Raynelle Bring MD    Encounter Date: 04/28/2020   PT End of Session - 04/28/20 1110    Visit Number 1    Number of Visits 2    Authorization Type BCBS COMM PPO (no auth, no VL)    PT Start Time 0910    PT Stop Time 0950    PT Time Calculation (min) 40 min    Activity Tolerance Patient tolerated treatment well    Behavior During Therapy [facility] for tasks assessed/performed           Past Medical History:  Diagnosis Date   Asthma    Gonorrhea 06/06/2016   H/O seasonal allergies     History reviewed. No pertinent surgical history.  There were no vitals filed for this visit.    Subjective Assessment - 04/28/20 0927    Subjective Patient presents to physical therapy with complaint of dizziness. Patient says this started about 3 months ago. She says she started to notice this when switching form 3rd shift to 1st shift at work. Says this has only happened at work. Says at first they thought this had to do with her blood sugar. Had this checked, it was fine. Then had blood pressure monitoring, was placed on blood pressure medication. Says dizziness persisted so she was prescribed Meclizine. Says she takes this as needed, not sure if it helps. Does reports history of anxiety and recently elevated stress level. States she has been prescribed anti-anxiety meds but is not currently taking these. Describes dizziness as more of a lightheaded feeling.    Limitations Other (comment)   Work   Patient Stated Goals Learn how to tame dizziness    Currently in Pain? No/denies              [facility] PT Assessment - 04/28/20 0001      Assessment   Medical Diagnosis Dizziness    Referring Provider (PT) Su Raynelle Bring MD      Prior Therapy No       Precautions   Precautions None      Restrictions   Weight Bearing Restrictions No      Balance Screen   Has the patient fallen in the past 6 months No      [place] residence      Prior Function   Level of Independence Independent      Cognition   Overall Cognitive Status Within Functional Limits for tasks assessed      Balance   Balance Assessed Yes      Static Standing Balance   Static Standing Balance -  Activities  Single Leg Stance - Right Leg;Single Leg Stance - Left Leg    Static Standing - Comment/# of Minutes 9-10 seconds bialaterally                   Vestibular Assessment - 04/28/20 0001      Symptom Behavior   Type of Dizziness  Lightheadedness    Frequency of Dizziness 2 x weekly     Duration of Dizziness Unsure    Symptom Nature Spontaneous    Aggravating Factors No known aggravating factors    Relieving Factors Rest    Progression of Symptoms  Worse      Oculomotor Exam   Oculomotor Alignment Normal    Ocular ROM WNL    Smooth Pursuits Intact    Saccades Intact      Vestibulo-Ocular Reflex   VOR 1 Head Only (x 1 viewing) Normal       Other Tests   Comments Standing march with eyes closed= very slight deviation toward LT               Objective measurements completed on examination: See above findings.               PT Education - 04/28/20 0927    Education Details On evaluation findings, differential diagnoses, lifestyle modifications and therapy POC    Person(s) Educated Patient    Methods Explanation    Comprehension Verbalized understanding            PT Short Term Goals - 04/28/20 1227      PT SHORT TERM GOAL #1   Title Patient will be independent with initial self-management strategies to improve functional outcomes and reduce episodes of dizziness    Time 2    Period Weeks    Status New    Target Date 05/14/20             PT Long Term  Goals - 04/28/20 1228      PT LONG TERM GOAL #1   Title = STG                  Plan - 04/28/20 1113    Clinical Impression Statement Patient is a 24 yo Female who presents to therapy with complaint of spontaneous intermittent dizziness. Patient subjective report and evaluation do not indicate positional component. Patient describes these episodes occur exclusively at her job. Vestibular nerve function appears normal with the exception of slight LT deviation with standing march with eyes closed. Patient reports no additional overlay of vision or hearing troubles, but does note headaches, which are present in the absence of dizziness. Patient describes history of anxiety and that she has recently experienced some traumatic events relating to family. She believes stress may be a factor in her symptoms. This could very well be a viable contributor to what she is experiencing. Thoroughly discussed differentials with patient and some lifestyle modification to further rule out possible vestibular components. Patient instructed to return for 1 x follow up to determine whether farther therapy is indicated.    Examination-Activity Limitations Other    Examination-Participation Restrictions Occupation    Stability/Clinical Decision Making Stable/Uncomplicated    Clinical Decision Making Low    Rehab Potential Good    PT Frequency 1x / week    PT Duration 2 weeks    PT Treatment/Interventions ADLs/Self Care Home Management;Canalith Repostioning;Neuromuscular re-education;Patient/family education;Therapeutic activities;Therapeutic exercise;Balance training;Vestibular    PT Next Visit Plan Assess response to modifications, further assessment as needed.    Consulted and Agree with Plan of Care Patient           Patient will benefit from skilled therapeutic intervention in order to improve the following deficits and impairments:  Dizziness  Visit Diagnosis: Dizziness and giddiness     Problem  List Patient Active Problem List   Diagnosis Date Noted   Gonorrhea 06/06/2016   Skin ulcer of buttock ([place]), resolved with  hyperpigmentation 04/27/2015   Herpes genitalis in women 04/12/2015   Sore throat 10/02/2013   Sinusitis, acute 10/02/2013   12:34 PM, 04/28/20 Josue Hector PT DPT  Physical Therapist with [facility]  [phone]   [facility] [address] [place], [place], [address] Phone: [phone]   Fax:  [phone]  Name: AKIRA PERUSSE MRN: [id number] Date of Birth: 04-08-1996

## 2020-05-11 ENCOUNTER — Other Ambulatory Visit: Payer: Self-pay

## 2020-05-11 ENCOUNTER — Ambulatory Visit (HOSPITAL_COMMUNITY): Payer: BC Managed Care – PPO | Admitting: Physical Therapy

## 2020-05-11 ENCOUNTER — Encounter (HOSPITAL_COMMUNITY): Payer: Self-pay | Admitting: Physical Therapy

## 2020-05-11 DIAGNOSIS — R42 Dizziness and giddiness: Secondary | ICD-10-CM

## 2020-05-11 NOTE — Therapy (Signed)
[place], [place], [address] Phone: [phone]   Fax:  [phone]  Physical Therapy Treatment  Patient Details  Name: Christy Goodman MRN: [id number] Date of Birth: 07/30/1995 Referring Provider (PT): Kasandra Knudsen Raynelle Bring MD   PHYSICAL THERAPY DISCHARGE SUMMARY  Visits from Start of Care: 2  Current functional level related to goals / functional outcomes: See below    Remaining deficits: See below    Education / Equipment: See assessment  Plan: Patient agrees to discharge.  Patient goals were met. Patient is being discharged due to being pleased with the current functional level.  ?????       Encounter Date: 05/11/2020   PT End of Session - 05/11/20 0940    Visit Number 2    Number of Visits 2    Authorization Type BCBS COMM PPO (no auth, no VL)    PT Start Time 0910    PT Stop Time 0940    PT Time Calculation (min) 30 min    Activity Tolerance Patient tolerated treatment well    Behavior During Therapy WFL for tasks assessed/performed           Past Medical History:  Diagnosis Date  . Asthma   . Gonorrhea 06/06/2016  . H/O seasonal allergies     History reviewed. No pertinent surgical history.  There were no vitals filed for this visit.   Subjective Assessment - 05/11/20 0915    Subjective Patient says she feels better since last time. Has not been having dizziness as bad. Says she has since made an appointment with optometrist for new glasses. Says she has been able to get breaks at work when she feels like dizziness will start, and when she has a few minutes to herself she is able to avoid these symptoms. She says she has not had any episodes within the past week. She says she thinks her symptoms may be related to stress.    Limitations Other (comment)   Work   Patient Stated Goals Learn how to tame dizziness    Currently in Pain? No/denies              [facility] PT Assessment - 05/11/20  0001      Assessment   Medical Diagnosis Dizziness    Referring Provider (PT) Su Raynelle Bring MD     Prior Therapy No       Precautions   Precautions None      Restrictions   Weight Bearing Restrictions No      Home Environment   Living Environment Private residence      Prior Function   Level of Independence Independent      Cognition   Overall Cognitive Status Within Functional Limits for tasks assessed               Vestibular Assessment - 05/11/20 0001      Positional Testing   Sidelying Test Sidelying Right;Sidelying Left      Sidelying Right   Sidelying Right Duration Negative      Sidelying Left   Sidelying Left Duration Negative                            PT Education - 05/11/20 1228    Education Details On assessment findings, follow up woth optomitrist, lifestyle mods to reduce symptoms, return to primary MD if no better. DC from therapy.  Person(s) Educated Patient    Methods Explanation    Comprehension Verbalized understanding            PT Short Term Goals - 05/11/20 1232      PT SHORT TERM GOAL #1   Title Patient will be independent with initial self-management strategies to improve functional outcomes and reduce episodes of dizziness    Baseline See education and assessment    Time 2    Period Weeks    Status Achieved    Target Date 05/14/20             PT Long Term Goals - 04/28/20 1228      PT LONG TERM GOAL #1   Title = STG                 Plan - 05/11/20 0940    Clinical Impression Statement Discussed differential diagnosis based on patient subjective reports. Assessed positional vertigo provocation with Brand Daroff exercise, testing was negative., Patient states she feels her symptoms are improving overall, but continue to happen only at work. Discussed more specific details of patient job tasks. It appears to involve a fair amount of looking at small square pattern of screen doors. Discussed how  this could cause strain on eyes and if dizziness symptoms seem to coincide with the days that she is assigned this task. Patient is following up with eye MD this afternoon about getting new prescription glasses. At this time patient does not appear to have vestibular dysfunction. Discussed follow up with optometrist, and subsequent follow up with primary care MD if symptoms persist. Answered all patient questions. Patient instructed to follow up with therapy services with any further questions or concerns.    Examination-Activity Limitations Other    Examination-Participation Restrictions Occupation    Stability/Clinical Decision Making Stable/Uncomplicated    Rehab Potential Good    PT Frequency 1x / week    PT Duration 2 weeks    PT Treatment/Interventions ADLs/Self Care Home Management;Canalith Repostioning;Neuromuscular re-education;Patient/family education;Therapeutic activities;Therapeutic exercise;Balance training;Vestibular    PT Next Visit Plan DC    Consulted and Agree with Plan of Care Patient           Patient will benefit from skilled therapeutic intervention in order to improve the following deficits and impairments:  Dizziness  Visit Diagnosis: Dizziness and giddiness     Problem List Patient Active Problem List   Diagnosis Date Noted  . Gonorrhea 06/06/2016  . Skin ulcer of buttock ([place]), resolved with  hyperpigmentation 04/27/2015  . Herpes genitalis in women 04/12/2015  . Sore throat 10/02/2013  . Sinusitis, acute 10/02/2013    12:33 PM, 05/11/20 Josue Hector PT DPT  Physical Therapist with [facility]  [phone]   [facility] [address] [place], [place], [address] Phone: [phone]   Fax:  [phone]  Name: Christy Goodman MRN: [id number] Date of Birth: 10-Jun-1995

## 2020-06-05 ENCOUNTER — Other Ambulatory Visit: Payer: Self-pay

## 2020-06-05 ENCOUNTER — Encounter (HOSPITAL_COMMUNITY): Payer: Self-pay | Admitting: Emergency Medicine

## 2020-06-05 ENCOUNTER — Emergency Department (HOSPITAL_COMMUNITY): Payer: BC Managed Care – PPO

## 2020-06-05 ENCOUNTER — Emergency Department (HOSPITAL_COMMUNITY)
Admission: EM | Admit: 2020-06-05 | Discharge: 2020-06-05 | Disposition: A | Discharge status: Home / Self Care | Payer: BC Managed Care – PPO | Attending: Emergency Medicine | Admitting: Emergency Medicine

## 2020-06-05 DIAGNOSIS — F17290 Nicotine dependence, other tobacco product, uncomplicated: Secondary | ICD-10-CM | POA: Diagnosis not present

## 2020-06-05 DIAGNOSIS — F17210 Nicotine dependence, cigarettes, uncomplicated: Secondary | ICD-10-CM | POA: Diagnosis not present

## 2020-06-05 DIAGNOSIS — R0789 Other chest pain: Secondary | ICD-10-CM | POA: Diagnosis present

## 2020-06-05 DIAGNOSIS — J45909 Unspecified asthma, uncomplicated: Secondary | ICD-10-CM | POA: Diagnosis not present

## 2020-06-05 DIAGNOSIS — R1011 Right upper quadrant pain: Secondary | ICD-10-CM | POA: Diagnosis not present

## 2020-06-05 DIAGNOSIS — Z79899 Other long term (current) drug therapy: Secondary | ICD-10-CM | POA: Diagnosis not present

## 2020-06-05 DIAGNOSIS — I10 Essential (primary) hypertension: Secondary | ICD-10-CM | POA: Diagnosis not present

## 2020-06-05 HISTORY — DX: Allergy, unspecified, initial encounter: T78.40XA

## 2020-06-05 HISTORY — DX: Essential (primary) hypertension: I10

## 2020-06-05 LAB — URINALYSIS, ROUTINE W REFLEX MICROSCOPIC
Bilirubin Urine: NEGATIVE
Glucose, UA: NEGATIVE mg/dL
Hgb urine dipstick: NEGATIVE
Ketones, ur: NEGATIVE mg/dL
Leukocytes,Ua: NEGATIVE
Nitrite: NEGATIVE
Protein, ur: NEGATIVE mg/dL
Specific Gravity, Urine: 1.008 (ref 1.005–1.030)
pH: 7 (ref 5.0–8.0)

## 2020-06-05 LAB — COMPREHENSIVE METABOLIC PANEL
ALT: 43 U/L (ref 0–44)
AST: 32 U/L (ref 15–41)
Albumin: 3.9 g/dL (ref 3.5–5.0)
Alkaline Phosphatase: 53 U/L (ref 38–126)
Anion gap: 7 (ref 5–15)
BUN: 11 mg/dL (ref 6–20)
CO2: 26 mmol/L (ref 22–32)
Calcium: 9 mg/dL (ref 8.9–10.3)
Chloride: 102 mmol/L (ref 98–111)
Creatinine, Ser: 0.91 mg/dL (ref 0.44–1.00)
GFR, Estimated: >60 mL/min (ref >60)
Glucose, Bld: 88 mg/dL (ref 70–99)
Potassium: 3.9 mmol/L (ref 3.5–5.1)
Sodium: 135 mmol/L (ref 135–145)
Total Bilirubin: 0.5 mg/dL (ref 0.3–1.2)
Total Protein: 7.3 g/dL (ref 6.5–8.1)

## 2020-06-05 LAB — CBC WITH DIFFERENTIAL/PLATELET
Abs Immature Granulocytes: 0.03 10*3/uL (ref 0.00–0.07)
Basophils Absolute: 0 10*3/uL (ref 0.0–0.1)
Basophils Relative: 0 %
Eosinophils Absolute: 0.1 10*3/uL (ref 0.0–0.5)
Eosinophils Relative: 1 %
HCT: 39.5 % (ref 36.0–46.0)
Hemoglobin: 12.8 g/dL (ref 12.0–15.0)
Immature Granulocytes: 0 %
Lymphocytes Relative: 22 %
Lymphs Abs: 2.2 10*3/uL (ref 0.7–4.0)
MCH: 28.7 pg (ref 26.0–34.0)
MCHC: 32.4 g/dL (ref 30.0–36.0)
MCV: 88.6 fL (ref 80.0–100.0)
Monocytes Absolute: 0.5 10*3/uL (ref 0.1–1.0)
Monocytes Relative: 5 %
Neutro Abs: 7.2 10*3/uL (ref 1.7–7.7)
Neutrophils Relative %: 72 %
Platelets: 318 10*3/uL (ref 150–400)
RBC: 4.46 MIL/uL (ref 3.87–5.11)
RDW: 14.6 % (ref 11.5–15.5)
WBC: 10.1 10*3/uL (ref 4.0–10.5)
nRBC: 0 % (ref 0.0–0.2)

## 2020-06-05 LAB — D-DIMER, QUANTITATIVE: D-Dimer, Quant: 0.27 ug/mL-FEU (ref 0.00–0.50)

## 2020-06-05 LAB — LIPASE, BLOOD: Lipase: 28 U/L (ref 11–51)

## 2020-06-05 LAB — POC URINE PREG, ED: Preg Test, Ur: NEGATIVE

## 2020-06-05 LAB — TROPONIN I (HIGH SENSITIVITY): Troponin I (High Sensitivity): <2 ng/L (ref <18)

## 2020-06-05 NOTE — Discharge Instructions (Signed)
Your lab tests, exam and xray including your ultrasound are normal today with no concerning source of your symptoms found.  This may be chest wall strain from your job (musculoskeletal) which I suggest your take ibuprofen 400 mg up to 3 times daily for the next several days.  Plan followup with your primary MD if your symptoms persist or do not resolve with this plan.

## 2020-06-05 NOTE — ED Triage Notes (Addendum)
Patient c/o non-radiating, mid-sternal chest pain that started this morning. Denies any shortness of breath or dizziness. Patient does report nausea. Denies any cardiac hx. Family hx of blood clots. Patient recently had blood pressure medication stopped and then restarted again x2 days ago (lisinopril)- patient original stopped due to dizziness.

## 2020-06-05 NOTE — ED Provider Notes (Signed)
[facility] EMERGENCY DEPARTMENT Provider Note   CSN: [id number] Arrival date & time: 06/05/20  [id number]     History Chief Complaint  Patient presents with  . Chest Pain    Christy Goodman is a 25 y.o. female with a history of asthma and hypertension, strong family history of DVT in both her mother and her grandmother presenting with sudden onset of midsternal chest pain occurring while at work this morning. She describes a very localized sharp pinching sensation which has been constant since her symptoms began around 8 AM this morning. She ate breakfast an hour prior to the onset of her symptoms which were tater tots from [employer]. With the onset of this pain she also endorsed nausea without emesis and had an urgent bowel movement, after which her nausea improved but she continues to have this localized pain. She denies shortness of breath, her pain is constant and not exertional and not worsened with deep inspiration, nonradiating. She denies palpitations or diaphoresis. She denies peripheral edema or leg pain. 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 No OCPs. She has had no treatments and has found no alleviators for her symptoms.  HPI     Past Medical History:  Diagnosis Date  . Allergies   . Asthma   . Gonorrhea 06/06/2016  . H/O seasonal allergies   . Hypertension     Patient Active Problem List   Diagnosis Date Noted  . Gonorrhea 06/06/2016  . Skin ulcer of buttock ([place]), resolved with  hyperpigmentation 04/27/2015  . Herpes genitalis in women 04/12/2015  . Sore throat 10/02/2013  . Sinusitis, acute 10/02/2013    No past surgical history on file.   OB History    Gravida  0   Para  0   Term  0   Preterm  0   AB  0   Living  0     SAB  0   IAB  0   Ectopic  0   Multiple  0   Live Births  0           Family History  Problem Relation Age of Onset  . Cancer Paternal Grandmother   . Diabetes Paternal Grandmother   .  Diabetes Paternal Aunt     Social History   Tobacco Use  . Smoking status: Current Some Day Smoker    Packs/day: 0.25    Years: 1.00    Pack years: 0.25    Types: Cigarettes, Cigars  . Smokeless tobacco: Never Used  Vaping Use  . Vaping Use: Never used  Substance Use Topics  . Alcohol use: Yes    Comment: occ  . Drug use: No    Home Medications Prior to Admission medications   Medication Sig Start Date End Date Taking? Authorizing Provider  albuterol (VENTOLIN HFA) 108 (90 Base) MCG/ACT inhaler Inhale 2 puffs into the lungs every 4 (four) hours as needed for wheezing or shortness of breath.  10/01/19   [provider]  amLODipine (NORVASC) 5 MG tablet Take 5 mg by mouth daily.  09/30/19   [provider]  hydrOXYzine (ATARAX/VISTARIL) 25 MG tablet Take 1 tablet (25 mg total) by mouth every 8 (eight) hours as needed for anxiety. Patient taking differently: Take 25 mg by mouth daily as needed for anxiety.  10/10/19   [place], [place], PA-C  ibuprofen (ADVIL) 200 MG tablet Take 600-800 mg by mouth daily as needed for headache or moderate pain.  [provider]  lisinopril (ZESTRIL) 5 MG tablet Take 5 mg by mouth daily. 10/01/19   [provider]  ondansetron (ZOFRAN ODT) 4 MG disintegrating tablet 4mg  ODT q4 hours prn nausea/vomit Patient not taking: Reported on 12/03/2019 10/10/19   Jacqlyn Larsen, PA-C    Allergies    Patient has no known allergies.  Review of Systems   Review of Systems  Constitutional: Negative for chills, diaphoresis and fever.  HENT: Negative.   Eyes: Negative.   Respiratory: Negative for cough, chest tightness and shortness of breath.   Cardiovascular: Positive for chest pain. Negative for palpitations and leg swelling.  Gastrointestinal: Positive for nausea. Negative for abdominal pain and vomiting.  Genitourinary: Negative.   Musculoskeletal: Negative for arthralgias, joint swelling and neck pain.  Skin: Negative.   Negative for rash and wound.  Neurological: Negative for dizziness, weakness, light-headedness, numbness and headaches.  Psychiatric/Behavioral: Negative.   All other systems reviewed and are negative.   Physical Exam Updated Vital Signs BP 129/85   Pulse 91   Temp 98 F (36.7 C) (Oral)   Resp 13   Ht 5\' 3"  (1.6 m)   Wt 110.7 kg   LMP 04/29/2019   SpO2 100%   BMI 43.22 kg/m   Physical Exam Vitals and nursing note reviewed.  Constitutional:      Appearance: She is well-developed and well-nourished.  HENT:     Head: Normocephalic and atraumatic.  Eyes:     Conjunctiva/sclera: Conjunctivae normal.  Cardiovascular:     Rate and Rhythm: Normal rate and regular rhythm.     Pulses: Intact distal pulses.     Heart sounds: Normal heart sounds.  Pulmonary:     Effort: Pulmonary effort is normal.     Breath sounds: Normal breath sounds. No wheezing.  Chest:     Chest wall: Tenderness present.     Comments: Patient has focal tenderness to palpation in her lower sternal region. Abdominal:     General: Abdomen is protuberant. Bowel sounds are normal.     Palpations: Abdomen is soft.     Tenderness: There is abdominal tenderness in the right upper quadrant. There is guarding.  Musculoskeletal:        General: Normal range of motion.     Cervical back: Normal range of motion.  Skin:    General: Skin is warm and dry.  Neurological:     Mental Status: She is alert.  Psychiatric:        Mood and Affect: Mood and affect normal.     ED Results / Procedures / Treatments   Labs (all labs ordered are listed, but only abnormal results are displayed) Labs Reviewed  URINALYSIS, ROUTINE W REFLEX MICROSCOPIC - Abnormal; Notable for the following components:      Result Value   Color, Urine STRAW (*)    All other components within normal limits  D-DIMER, QUANTITATIVE (NOT AT [facility])  CBC WITH DIFFERENTIAL/PLATELET  COMPREHENSIVE METABOLIC PANEL  LIPASE, BLOOD  POC URINE PREG, ED   TROPONIN I (HIGH SENSITIVITY)    EKG EKG Interpretation  Date/Time:  Saturday June 05 2020 09:44:18 EST Ventricular Rate:  92 PR Interval:    QRS Duration: 75 QT Interval:  350 QTC Calculation: 433 R Axis:   67 Text Interpretation: Sinus rhythm Borderline T wave abnormalities No significant change since last tracing Confirmed by Isla Pence [phone]) on 06/05/2020 9:53:27 AM   Radiology DG Chest 2 View  Result Date: 06/05/2020 CLINICAL DATA:  Chest pain EXAM: CHEST - 2 VIEW COMPARISON:  12/29/2019 FINDINGS: The heart size and mediastinal contours are within normal limits. Both lungs are clear. No pleural effusion or pneumothorax. The visualized skeletal structures are unremarkable. IMPRESSION: No acute process in the chest. Electronically Signed   By: Macy Mis M.D.   On: 06/05/2020 11:51   US Abdomen Limited RUQ (LIVER/GB)  Result Date: 06/05/2020 CLINICAL DATA:  RIGHT upper quadrant pain today. EXAM: ULTRASOUND ABDOMEN LIMITED RIGHT UPPER QUADRANT COMPARISON:  None. FINDINGS: Gallbladder: No gallstones or wall thickening visualized. No sonographic Murphy sign noted by sonographer. Common bile duct: Diameter: 4 mm Liver: Liver is diffusely echogenic indicating fatty infiltration. No focal mass or lesion is demonstrated within the liver. Portal vein is patent on color Doppler imaging with normal direction of blood flow towards the liver. Other: None. IMPRESSION: 1. No acute findings.  No gallstones.  No evidence of cholecystitis. 2. Fatty infiltration of the liver. Electronically Signed   By: Franki Cabot M.D.   On: 06/05/2020 12:02    Procedures Procedures (including critical care time)  Medications Ordered in ED Medications - No data to display  ED Course  I have reviewed the triage vital signs and the nursing notes.  Pertinent labs & imaging results that were available during my care of the patient were reviewed by me and considered in my medical decision making (see  chart for details).    MDM Rules/Calculators/A&P                          Patient with presentation of atypical chest pain with nausea and emesis x1, right upper quadrant pain on exam but with negative testing including a negative troponin, LFTs and lipase along with a negative gallbladder ultrasound study.  Her chest x-ray is also clear and her EKG is normal sinus rhythm.  Her pain is reproducible suggesting possible musculoskeletal source.  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  She was concerned about possible blood clot as this runs in her family, her D-dimer was low negative today.  She has no complaint of shortness of breath and her vital signs are stable including normal pulse rate and oxygen saturation, she is PERC negative.  Advised follow-up with her PCP if her symptoms persist, return precautions were also given. Final Clinical Impression(s) / ED Diagnoses Final diagnoses:  RUQ abdominal pain  Atypical chest pain    Rx / DC Orders ED Discharge Orders    None       Landis Martins 06/05/20 1333    Isla Pence, MD 06/05/20 1402

## 2020-06-08 ENCOUNTER — Encounter: Payer: Self-pay | Admitting: Nurse Practitioner

## 2020-06-08 ENCOUNTER — Ambulatory Visit: Payer: BC Managed Care – PPO | Admitting: Nurse Practitioner

## 2020-06-08 ENCOUNTER — Other Ambulatory Visit: Payer: Self-pay

## 2020-06-08 DIAGNOSIS — J45909 Unspecified asthma, uncomplicated: Secondary | ICD-10-CM | POA: Insufficient documentation

## 2020-06-08 DIAGNOSIS — Z139 Encounter for screening, unspecified: Secondary | ICD-10-CM

## 2020-06-08 DIAGNOSIS — I10 Essential (primary) hypertension: Secondary | ICD-10-CM

## 2020-06-08 DIAGNOSIS — R42 Dizziness and giddiness: Secondary | ICD-10-CM

## 2020-06-08 DIAGNOSIS — Z7689 Persons encountering health services in other specified circumstances: Secondary | ICD-10-CM | POA: Insufficient documentation

## 2020-06-08 DIAGNOSIS — J4530 Mild persistent asthma, uncomplicated: Secondary | ICD-10-CM

## 2020-06-08 DIAGNOSIS — Z0001 Encounter for general adult medical examination with abnormal findings: Secondary | ICD-10-CM | POA: Insufficient documentation

## 2020-06-08 NOTE — Assessment & Plan Note (Signed)
-  BP is well controlled today -taking lisinopril 5 mg daily

## 2020-06-08 NOTE — Assessment & Plan Note (Signed)
-  no issues today -uses albuterol inhaler PRN

## 2020-06-08 NOTE — Assessment & Plan Note (Signed)
-  has episodes at work where she feels she will pass out -saw ENT in the past, she thinks Dr. Benjamine Mola; also had PT, but that didn't help -will need records to prevent duplicating treatment

## 2020-06-08 NOTE — Assessment & Plan Note (Signed)
-  requesting records

## 2020-06-08 NOTE — Progress Notes (Signed)
New Patient Office Visit  Subjective:  Patient ID: Christy Goodman, female    DOB: Jun 11, 1995  Age: 25 y.o. MRN: [id number]  CC:  Chief Complaint  Patient presents with  . New Patient (Initial Visit)    HPI Christy Goodman presents for new patient visit. Transferring care from the [facility] clinic. No recent physical exam.  Last labs with PCP over a year ago. No recent GYN or PAP. She has concerns about her blood pressure. She has been having dizzy spells, she feels hot. She has had BP as high as 190/140 while at work.  She is taking lisinopril, but is unsure of her dosage. She was taking medication for vertigo, but she was told she has another condition.  She was referred to ENT, [place].   Past Medical History:  Diagnosis Date  . Allergies   . Asthma   . Gonorrhea 06/06/2016  . Gonorrhea 06/06/2016  . H/O seasonal allergies   . Hypertension     History reviewed. No pertinent surgical history.  Family History  Problem Relation Age of Onset  . Cancer Paternal Grandmother   . Diabetes Paternal Grandmother   . Diabetes Paternal Aunt     Social History   Socioeconomic History  . Marital status: Single    Spouse name: Not on file  . Number of children: Not on file  . Years of education: Not on file  . Highest education level: Not on file  Occupational History  . Not on file  Tobacco Use  . Smoking status: Current Some Day Smoker    Packs/day: 0.25    Years: 1.00    Pack years: 0.25    Types: Cigarettes, Cigars  . Smokeless tobacco: Never Used  Vaping Use  . Vaping Use: Never used  Substance and Sexual Activity  . Alcohol use: Yes    Comment: occ; tequila  . Drug use: Yes    Types: Marijuana    Comment: 3 joints per day  . Sexual activity: Yes    Birth control/protection: None  Other Topics Concern  . Not on file  Social History Narrative  . Not on file   Social Determinants of Health   Financial Resource Strain: Not on file  Food Insecurity: Not on  file  Transportation Needs: Not on file  Physical Activity: Not on file  Stress: Not on file  Social Connections: Not on file  Intimate Partner Violence: Not on file    ROS Review of Systems  HENT:       Has ear pain/fullness at times, but nothing today  Respiratory: Negative.   Cardiovascular: Negative.   Neurological: Positive for dizziness.       No dizziness today, was seen by ENT    Objective:   Today's Vitals: BP 125/76   Pulse 98   Temp 98.5 F (36.9 C)   Resp 20   Ht 5\' 3"  (1.6 m)   Wt 246 lb (111.6 kg)   SpO2 98%   BMI 43.58 kg/m   Physical Exam  Assessment & Plan:   Problem List Items Addressed This Visit      Cardiovascular and Mediastinum   Hypertension, essential    -BP is well controlled today -taking lisinopril 5 mg daily        Respiratory   Asthma    -no issues today -uses albuterol inhaler PRN        Other   Dizziness    -has episodes at work where she  feels she will pass out -saw ENT in the past, she thinks Dr. Benjamine Mola; also had PT, but that didn't help -will need records to prevent duplicating treatment      Encounter to establish care    -requesting records       Screening due    -will screen for HIV and HCV with next set of labs         Outpatient Encounter Medications as of 06/08/2020  Medication Sig  . albuterol (VENTOLIN HFA) 108 (90 Base) MCG/ACT inhaler Inhale 2 puffs into the lungs every 4 (four) hours as needed for wheezing or shortness of breath.   . lisinopril (ZESTRIL) 5 MG tablet Take 5 mg by mouth daily.  . [DISCONTINUED] amLODipine (NORVASC) 5 MG tablet Take 5 mg by mouth daily.  (Patient not taking: Reported on 06/08/2020)  . [DISCONTINUED] hydrOXYzine (ATARAX/VISTARIL) 25 MG tablet Take 1 tablet (25 mg total) by mouth every 8 (eight) hours as needed for anxiety. (Patient not taking: Reported on 06/08/2020)  . [DISCONTINUED] ibuprofen (ADVIL) 200 MG tablet Take 600-800 mg by mouth daily as needed for headache or  moderate pain. (Patient not taking: Reported on 06/08/2020)  . [DISCONTINUED] ondansetron (ZOFRAN ODT) 4 MG disintegrating tablet 4mg  ODT q4 hours prn nausea/vomit (Patient not taking: No sig reported)   No facility-administered encounter medications on file as of 06/08/2020.    Follow-up: Return in about 3 weeks (around 06/29/2020) for Physical with PAP.   Noreene Larsson, NP

## 2020-06-08 NOTE — Addendum Note (Signed)
Addended by: Demetrius Revel on: 06/08/2020 08:48 AM   Modules accepted: Orders

## 2020-06-08 NOTE — Assessment & Plan Note (Signed)
-  will screen for HIV and HCV with next set of labs 

## 2020-06-21 ENCOUNTER — Telehealth: Payer: Self-pay

## 2020-06-21 NOTE — Telephone Encounter (Signed)
Copied Noted Sleeve

## 2020-06-22 LAB — CBC WITH DIFFERENTIAL/PLATELET
Basophils Absolute: 0 10*3/uL (ref 0.0–0.2)
Basos: 1 %
EOS (ABSOLUTE): 0.1 10*3/uL (ref 0.0–0.4)
Eos: 1 %
Hematocrit: 36.4 % (ref 34.0–46.6)
Hemoglobin: 12.6 g/dL (ref 11.1–15.9)
Immature Grans (Abs): 0 10*3/uL (ref 0.0–0.1)
Immature Granulocytes: 0 %
Lymphocytes Absolute: 2.3 10*3/uL (ref 0.7–3.1)
Lymphs: 36 %
MCH: 29 pg (ref 26.6–33.0)
MCHC: 34.6 g/dL (ref 31.5–35.7)
MCV: 84 fL (ref 79–97)
Monocytes Absolute: 0.3 10*3/uL (ref 0.1–0.9)
Monocytes: 5 %
Neutrophils Absolute: 3.7 10*3/uL (ref 1.4–7.0)
Neutrophils: 57 %
Platelets: 326 10*3/uL (ref 150–450)
RBC: 4.35 x10E6/uL (ref 3.77–5.28)
RDW: 13.9 % (ref 11.7–15.4)
WBC: 6.4 10*3/uL (ref 3.4–10.8)

## 2020-06-22 LAB — HCV INTERPRETATION

## 2020-06-22 LAB — CMP14+EGFR
ALT: 49 IU/L — ABNORMAL HIGH (ref 0–32)
AST: 32 IU/L (ref 0–40)
Albumin/Globulin Ratio: 1.8 (ref 1.2–2.2)
Albumin: 4.3 g/dL (ref 3.9–5.0)
Alkaline Phosphatase: 55 IU/L (ref 44–121)
BUN/Creatinine Ratio: 10 (ref 9–23)
BUN: 8 mg/dL (ref 6–20)
Bilirubin Total: 0.2 mg/dL (ref 0.0–1.2)
CO2: 21 mmol/L (ref 20–29)
Calcium: 8.8 mg/dL (ref 8.7–10.2)
Chloride: 106 mmol/L (ref 96–106)
Creatinine, Ser: 0.8 mg/dL (ref 0.57–1.00)
GFR calc Af Amer: 119 mL/min/{1.73_m2} (ref >59)
GFR calc non Af Amer: 103 mL/min/{1.73_m2} (ref >59)
Globulin, Total: 2.4 g/dL (ref 1.5–4.5)
Glucose: 95 mg/dL (ref 65–99)
Potassium: 3.9 mmol/L (ref 3.5–5.2)
Sodium: 143 mmol/L (ref 134–144)
Total Protein: 6.7 g/dL (ref 6.0–8.5)

## 2020-06-22 LAB — LIPID PANEL WITH LDL/HDL RATIO
Cholesterol, Total: 112 mg/dL (ref 100–199)
HDL: 36 mg/dL — ABNORMAL LOW (ref >39)
LDL Chol Calc (NIH): 53 mg/dL (ref 0–99)
LDL/HDL Ratio: 1.5 ratio (ref 0.0–3.2)
Triglycerides: 130 mg/dL (ref 0–149)
VLDL Cholesterol Cal: 23 mg/dL (ref 5–40)

## 2020-06-22 LAB — TSH+FREE T4
Free T4: 1.41 ng/dL (ref 0.82–1.77)
TSH: 1.55 u[IU]/mL (ref 0.450–4.500)

## 2020-06-22 LAB — HCV AB W/RFLX TO VERIFICATION: HCV Ab: <0.1 s/co ratio (ref 0.0–0.9)

## 2020-06-22 LAB — HIV ANTIBODY (ROUTINE TESTING W REFLEX): HIV Screen 4th Generation wRfx: NONREACTIVE

## 2020-06-22 NOTE — Progress Notes (Signed)
Her labs look great. She was negative for hep C and HIV. Her liver enzyme ALT was slightly elevated. Nothing to be concerned about. It is likely just fatty liver, but we will keep an eye on it about every 6 months to make sure it is stable.

## 2020-06-29 ENCOUNTER — Encounter: Payer: BC Managed Care – PPO | Admitting: Nurse Practitioner

## 2020-06-30 ENCOUNTER — Other Ambulatory Visit: Payer: Self-pay

## 2020-06-30 ENCOUNTER — Ambulatory Visit: Payer: BC Managed Care – PPO | Admitting: Nurse Practitioner

## 2020-06-30 ENCOUNTER — Other Ambulatory Visit (HOSPITAL_COMMUNITY)
Admission: RE | Admit: 2020-06-30 | Discharge: 2020-06-30 | Disposition: A | Discharge status: Home / Self Care | Payer: BC Managed Care – PPO | Source: Ambulatory Visit | Attending: Nurse Practitioner | Admitting: Nurse Practitioner

## 2020-06-30 ENCOUNTER — Encounter: Payer: Self-pay | Admitting: Nurse Practitioner

## 2020-06-30 VITALS — BP 126/80 | HR 94 | Temp 98.9°F | Resp 20 | Ht 63.0 in | Wt 241.0 lb

## 2020-06-30 DIAGNOSIS — I10 Essential (primary) hypertension: Secondary | ICD-10-CM

## 2020-06-30 DIAGNOSIS — Z124 Encounter for screening for malignant neoplasm of cervix: Secondary | ICD-10-CM

## 2020-06-30 DIAGNOSIS — J4530 Mild persistent asthma, uncomplicated: Secondary | ICD-10-CM | POA: Diagnosis not present

## 2020-06-30 DIAGNOSIS — R42 Dizziness and giddiness: Secondary | ICD-10-CM

## 2020-06-30 MED ORDER — LISINOPRIL 5 MG PO TABS: 5.0000 mg | ORAL_TABLET | Freq: Every day | ORAL | 1 refills | Status: DC

## 2020-06-30 NOTE — Progress Notes (Signed)
Established Patient Office Visit  Subjective:  Patient ID: Christy Goodman, female    DOB: [birth year]  Age: 25 y.o. MRN: [id number]  CC:  Chief Complaint  Patient presents with  . Annual Exam    HPI [name] presents for physical exam. She states she is getting over a recent illness, but she tested negative for COVID.  Her last dizziness episode was a while ago... "been a minute since I had one".   Past Medical History:  Diagnosis Date  . Allergies   . Asthma   . Gonorrhea 06/06/2016  . Gonorrhea 06/06/2016  . H/O seasonal allergies   . Hypertension     History reviewed. No pertinent surgical history.  Family History  Problem Relation Age of Onset  . Cancer Paternal Grandmother   . Diabetes Paternal Grandmother   . Diabetes Paternal Aunt     Social History   Socioeconomic History  . Marital status: Single    Spouse name: Not on file  . Number of children: Not on file  . Years of education: Not on file  . Highest education level: Not on file  Occupational History  . Not on file  Tobacco Use  . Smoking status: Current Some Day Smoker    Packs/day: 0.25    Years: 1.00    Pack years: 0.25    Types: Cigarettes, Cigars  . Smokeless tobacco: Never Used  Vaping Use  . Vaping Use: Never used  Substance and Sexual Activity  . Alcohol use: Yes    Comment: occ; tequila  . Drug use: Yes    Types: Marijuana    Comment: 3 joints per day  . Sexual activity: Yes    Birth control/protection: None    Comment: sexual partner is female- no chance of pregnancy  Other Topics Concern  . Not on file  Social History Narrative  . Not on file   Social Determinants of Health   Financial Resource Strain: Not on file  Food Insecurity: Not on file  Transportation Needs: Not on file  Physical Activity: Not on file  Stress: Not on file  Social Connections: Not on file  Intimate Partner Violence: Not on file    Outpatient Medications Prior to Visit  Medication Sig  Dispense Refill  . albuterol (VENTOLIN HFA) 108 (90 Base) MCG/ACT inhaler Inhale 2 puffs into the lungs every 4 (four) hours as needed for wheezing or shortness of breath.     . lisinopril (ZESTRIL) 5 MG tablet Take 5 mg by mouth daily.     No facility-administered medications prior to visit.    No Known Allergies  ROS Review of Systems  Constitutional: Negative.   HENT: Negative.   Eyes: Negative.   Respiratory: Negative.   Cardiovascular: Negative.   Gastrointestinal: Negative.   Endocrine: Negative.   Genitourinary: Negative.   Musculoskeletal: Negative.   Skin: Negative.   Allergic/Immunologic: Negative.   Neurological: Negative.   Hematological: Negative.   Psychiatric/Behavioral: Negative.       Objective:    Physical Exam Exam conducted with a chaperone present.  Constitutional:      Appearance: She is obese.  HENT:     Head: Normocephalic and atraumatic.     Right Ear: Tympanic membrane, ear canal and external ear normal.     Left Ear: Tympanic membrane, ear canal and external ear normal.     Nose: Nose normal.     Mouth/Throat:     Mouth: Mucous membranes are moist.  Pharynx: Oropharynx is clear.  Eyes:     Extraocular Movements: Extraocular movements intact.     Conjunctiva/sclera: Conjunctivae normal.     Pupils: Pupils are equal, round, and reactive to light.  Cardiovascular:     Rate and Rhythm: Normal rate and regular rhythm.     Pulses: Normal pulses.     Heart sounds: Normal heart sounds.  Pulmonary:     Effort: Pulmonary effort is normal.     Breath sounds: Normal breath sounds.  Abdominal:     General: Abdomen is flat.     Palpations: Abdomen is soft.  Genitourinary:    General: Normal vulva.  Musculoskeletal:        General: Normal range of motion.     Cervical back: Normal range of motion and neck supple.  Skin:    General: Skin is warm and dry.     Capillary Refill: Capillary refill takes less than 2 seconds.  Neurological:      General: No focal deficit present.     Mental Status: She is alert and oriented to person, place, and time.  Psychiatric:        Mood and Affect: Mood normal.        Behavior: Behavior normal.        Thought Content: Thought content normal.        Judgment: Judgment normal.     BP 126/80   Pulse 94   Temp 98.9 F (37.2 C)   Resp 20   Ht 5\' 3"  (1.6 m)   Wt 241 lb (109.3 kg)   SpO2 96%   BMI 42.69 kg/m  Wt Readings from Last 3 Encounters:  06/30/20 241 lb (109.3 kg)  06/08/20 246 lb (111.6 kg)  06/05/20 244 lb (110.7 kg)     Health Maintenance Due  Topic Date Due  . COVID-19 Vaccine (1) Never done  . PAP-Cervical Cytology Screening  Never done  . PAP SMEAR-Modifier  Never done    There are no preventive care reminders to display for this patient.  Lab Results  Component Value Date   TSH 1.550 06/21/2020   Lab Results  Component Value Date   WBC 6.4 06/21/2020   HGB 12.6 06/21/2020   HCT 36.4 06/21/2020   MCV 84 06/21/2020   PLT 326 06/21/2020   Lab Results  Component Value Date   NA 143 06/21/2020   K 3.9 06/21/2020   CO2 21 06/21/2020   GLUCOSE 95 06/21/2020   BUN 8 06/21/2020   CREATININE 0.80 06/21/2020   BILITOT 0.2 06/21/2020   ALKPHOS 55 06/21/2020   AST 32 06/21/2020   ALT 49 (H) 06/21/2020   PROT 6.7 06/21/2020   ALBUMIN 4.3 06/21/2020   CALCIUM 8.8 06/21/2020   ANIONGAP 7 06/05/2020   Lab Results  Component Value Date   CHOL 112 06/21/2020   Lab Results  Component Value Date   HDL 36 (L) 06/21/2020   Lab Results  Component Value Date   LDLCALC 53 06/21/2020   Lab Results  Component Value Date   TRIG 130 06/21/2020   No results found for: CHOLHDL No results found for: HGBA1C    Assessment & Plan:   Problem List Items Addressed This Visit      Cardiovascular and Mediastinum   Hypertension, essential    -BP well-controlled -refilled lisinopril      Relevant Medications   lisinopril (ZESTRIL) 5 MG tablet      Respiratory   Asthma    -  well controlled today        Other   Dizziness    -no recent issues -saw Dr. Benjamine Mola in the past; records reviewed and sent to be scanned by medical records       Other Visit Diagnoses    Pap smear for cervical cancer screening    -  Primary   Relevant Orders   Cytology - PAP      Meds ordered this encounter  Medications  . lisinopril (ZESTRIL) 5 MG tablet    Sig: Take 1 tablet (5 mg total) by mouth daily.    Dispense:  90 tablet    Refill:  1    Follow-up: Return in about 1 year (around 06/30/2021) for physical exam (no PAP).    Noreene Larsson, NP

## 2020-06-30 NOTE — Assessment & Plan Note (Signed)
-  no recent issues -saw Dr. Benjamine Mola in the past; records reviewed and sent to be scanned by medical records

## 2020-06-30 NOTE — Assessment & Plan Note (Signed)
-  BP well-controlled -refilled lisinopril

## 2020-06-30 NOTE — Patient Instructions (Signed)
Your physical exam had all normal findings.  We will send off the PAP smear and call you with results.  Will will meet back up in a year for a physical since your labs were great.  Of course, if you need anything we are here for you!

## 2020-06-30 NOTE — Assessment & Plan Note (Signed)
-  well controlled today 

## 2020-07-06 LAB — CYTOLOGY - PAP
Comment: NEGATIVE
Diagnosis: NEGATIVE
High risk HPV: NEGATIVE

## 2020-07-06 NOTE — Progress Notes (Signed)
PAP smear was negative. No need to repeat for 3 years.

## 2020-08-30 NOTE — Telephone Encounter (Signed)
I never received an original copy to be filled out. It looks like in the media tab pt had these forms filled out at the [facility] where she transferred from. Did the patient call about these forms? Or is she having new onset dizziness she needs to be seen for?

## 2020-08-30 NOTE — Telephone Encounter (Signed)
Has these been completed?

## 2020-08-31 ENCOUNTER — Other Ambulatory Visit: Payer: Self-pay

## 2020-08-31 ENCOUNTER — Telehealth: Payer: Self-pay

## 2020-08-31 ENCOUNTER — Ambulatory Visit: Payer: BC Managed Care – PPO | Admitting: Nurse Practitioner

## 2020-08-31 ENCOUNTER — Encounter: Payer: Self-pay | Admitting: Nurse Practitioner

## 2020-08-31 DIAGNOSIS — R112 Nausea with vomiting, unspecified: Secondary | ICD-10-CM | POA: Insufficient documentation

## 2020-08-31 MED ORDER — ONDANSETRON 4 MG PO TBDP
4.0000 mg | ORAL_TABLET | Freq: Three times a day (TID) | ORAL | 0 refills | Status: DC | PRN
Start: 2020-08-31 — End: 2022-06-14

## 2020-08-31 NOTE — Progress Notes (Signed)
Acute Office Visit  Subjective:    Patient ID: Christy Goodman, female    DOB: July 01, 1995, 25 y.o.   MRN: [id number]  Chief Complaint  Patient presents with  . Hypertension    Nausea and headaches     HPI Patient is in today for BP check and nausea.  Yesterday, she ate food and felt nauseated and vomited.  She states that she had thick yellow fluid come up when she vomited.  After eating later in the day, she didn't have any nausea.  She states that random N/V vomiting episodes started 2 weeks ago.  She only had vomiting yesterday, but the previous episodes had gagging.    Past Medical History:  Diagnosis Date  . Allergies   . Asthma   . Gonorrhea 06/06/2016  . Gonorrhea 06/06/2016  . H/O seasonal allergies   . Hypertension     History reviewed. No pertinent surgical history.  Family History  Problem Relation Age of Onset  . Cancer Paternal Grandmother   . Diabetes Paternal Grandmother   . Diabetes Paternal Aunt     Social History   Socioeconomic History  . Marital status: Single    Spouse name: Not on file  . Number of children: Not on file  . Years of education: Not on file  . Highest education level: Not on file  Occupational History  . Not on file  Tobacco Use  . Smoking status: Current Some Day Smoker    Packs/day: 0.25    Years: 1.00    Pack years: 0.25    Types: Cigarettes, Cigars  . Smokeless tobacco: Never Used  Vaping Use  . Vaping Use: Never used  Substance and Sexual Activity  . Alcohol use: Yes    Comment: occ; tequila  . Drug use: Yes    Types: Marijuana    Comment: 3 joints per day  . Sexual activity: Yes    Birth control/protection: None    Comment: sexual partner is female- no chance of pregnancy  Other Topics Concern  . Not on file  Social History Narrative  . Not on file   Social Determinants of Health   Financial Resource Strain: Not on file  Food Insecurity: Not on file  Transportation Needs: Not on file  Physical  Activity: Not on file  Stress: Not on file  Social Connections: Not on file  Intimate Partner Violence: Not on file    Outpatient Medications Prior to Visit  Medication Sig Dispense Refill  . albuterol (VENTOLIN HFA) 108 (90 Base) MCG/ACT inhaler Inhale 2 puffs into the lungs every 4 (four) hours as needed for wheezing or shortness of breath.     . lisinopril (ZESTRIL) 5 MG tablet Take 1 tablet (5 mg total) by mouth daily. 90 tablet 1   No facility-administered medications prior to visit.    No Known Allergies  Review of Systems  Constitutional: Negative.   Respiratory: Negative.   Cardiovascular: Negative.   Gastrointestinal: Positive for nausea and vomiting.       No vomiting today  Psychiatric/Behavioral: Negative.        Objective:    Physical Exam Constitutional:      Appearance: Normal appearance.  Cardiovascular:     Rate and Rhythm: Normal rate and regular rhythm.     Pulses: Normal pulses.     Heart sounds: Normal heart sounds.  Pulmonary:     Effort: Pulmonary effort is normal.     Breath sounds: Normal breath sounds.  Abdominal:     General: Abdomen is flat. Bowel sounds are normal. There is no distension.     Palpations: Abdomen is soft. There is no mass.     Tenderness: There is no abdominal tenderness. There is no guarding or rebound.     Hernia: No hernia is present.  Neurological:     Mental Status: She is alert.     BP 140/85   Pulse 92   Temp 98.6 F (37 C)   Resp 20   Ht 5\' 3"  (1.6 m)   Wt 240 lb (108.9 kg)   SpO2 98%   BMI 42.51 kg/m  Wt Readings from Last 3 Encounters:  08/31/20 240 lb (108.9 kg)  06/30/20 241 lb (109.3 kg)  06/08/20 246 lb (111.6 kg)    There are no preventive care reminders to display for this patient.  There are no preventive care reminders to display for this patient.   Lab Results  Component Value Date   TSH 1.550 06/21/2020   Lab Results  Component Value Date   WBC 6.4 06/21/2020   HGB 12.6  06/21/2020   HCT 36.4 06/21/2020   MCV 84 06/21/2020   PLT 326 06/21/2020   Lab Results  Component Value Date   NA 143 06/21/2020   K 3.9 06/21/2020   CO2 21 06/21/2020   GLUCOSE 95 06/21/2020   BUN 8 06/21/2020   CREATININE 0.80 06/21/2020   BILITOT 0.2 06/21/2020   ALKPHOS 55 06/21/2020   AST 32 06/21/2020   ALT 49 (H) 06/21/2020   PROT 6.7 06/21/2020   ALBUMIN 4.3 06/21/2020   CALCIUM 8.8 06/21/2020   ANIONGAP 7 06/05/2020   Lab Results  Component Value Date   CHOL 112 06/21/2020   Lab Results  Component Value Date   HDL 36 (L) 06/21/2020   Lab Results  Component Value Date   LDLCALC 53 06/21/2020   Lab Results  Component Value Date   TRIG 130 06/21/2020   No results found for: CHOLHDL No results found for: HGBA1C     Assessment & Plan:   Problem List Items Addressed This Visit      Digestive   Nausea & vomiting    -occurs in the morning; suggested pregnancy test, but she states she is a lesbian and has not had heterosexual intercourse -Rx. zofran -if no improvement, will consider GI consult          Meds ordered this encounter  Medications  . ondansetron (ZOFRAN-ODT) 4 MG disintegrating tablet    Sig: Take 1 tablet (4 mg total) by mouth every 8 (eight) hours as needed for nausea or vomiting.    Dispense:  20 tablet    Refill:  0     Noreene Larsson, NP

## 2020-08-31 NOTE — Assessment & Plan Note (Signed)
-  occurs in the morning; suggested pregnancy test, but she states she is a lesbian and has not had heterosexual intercourse -Rx. zofran -if no improvement, will consider GI consult

## 2020-08-31 NOTE — Telephone Encounter (Signed)
FMLA forms received  Copied Noted Sleeved

## 2020-09-02 DIAGNOSIS — Z0279 Encounter for issue of other medical certificate: Secondary | ICD-10-CM

## 2020-09-02 NOTE — Telephone Encounter (Signed)
complete

## 2020-12-01 ENCOUNTER — Telehealth: Payer: Self-pay

## 2020-12-01 NOTE — Telephone Encounter (Signed)
Printed for pt. Up front.

## 2020-12-01 NOTE — Telephone Encounter (Signed)
Patient called need immunization records for her job printed out.

## 2021-01-04 IMAGING — DX DG CHEST 2V
2 series · 2 of 2 positions shown · non-contrast
Comparison: 12/03/2019

CLINICAL DATA: Chest pain

EXAM:
CHEST - 2 VIEW

[chest pa]
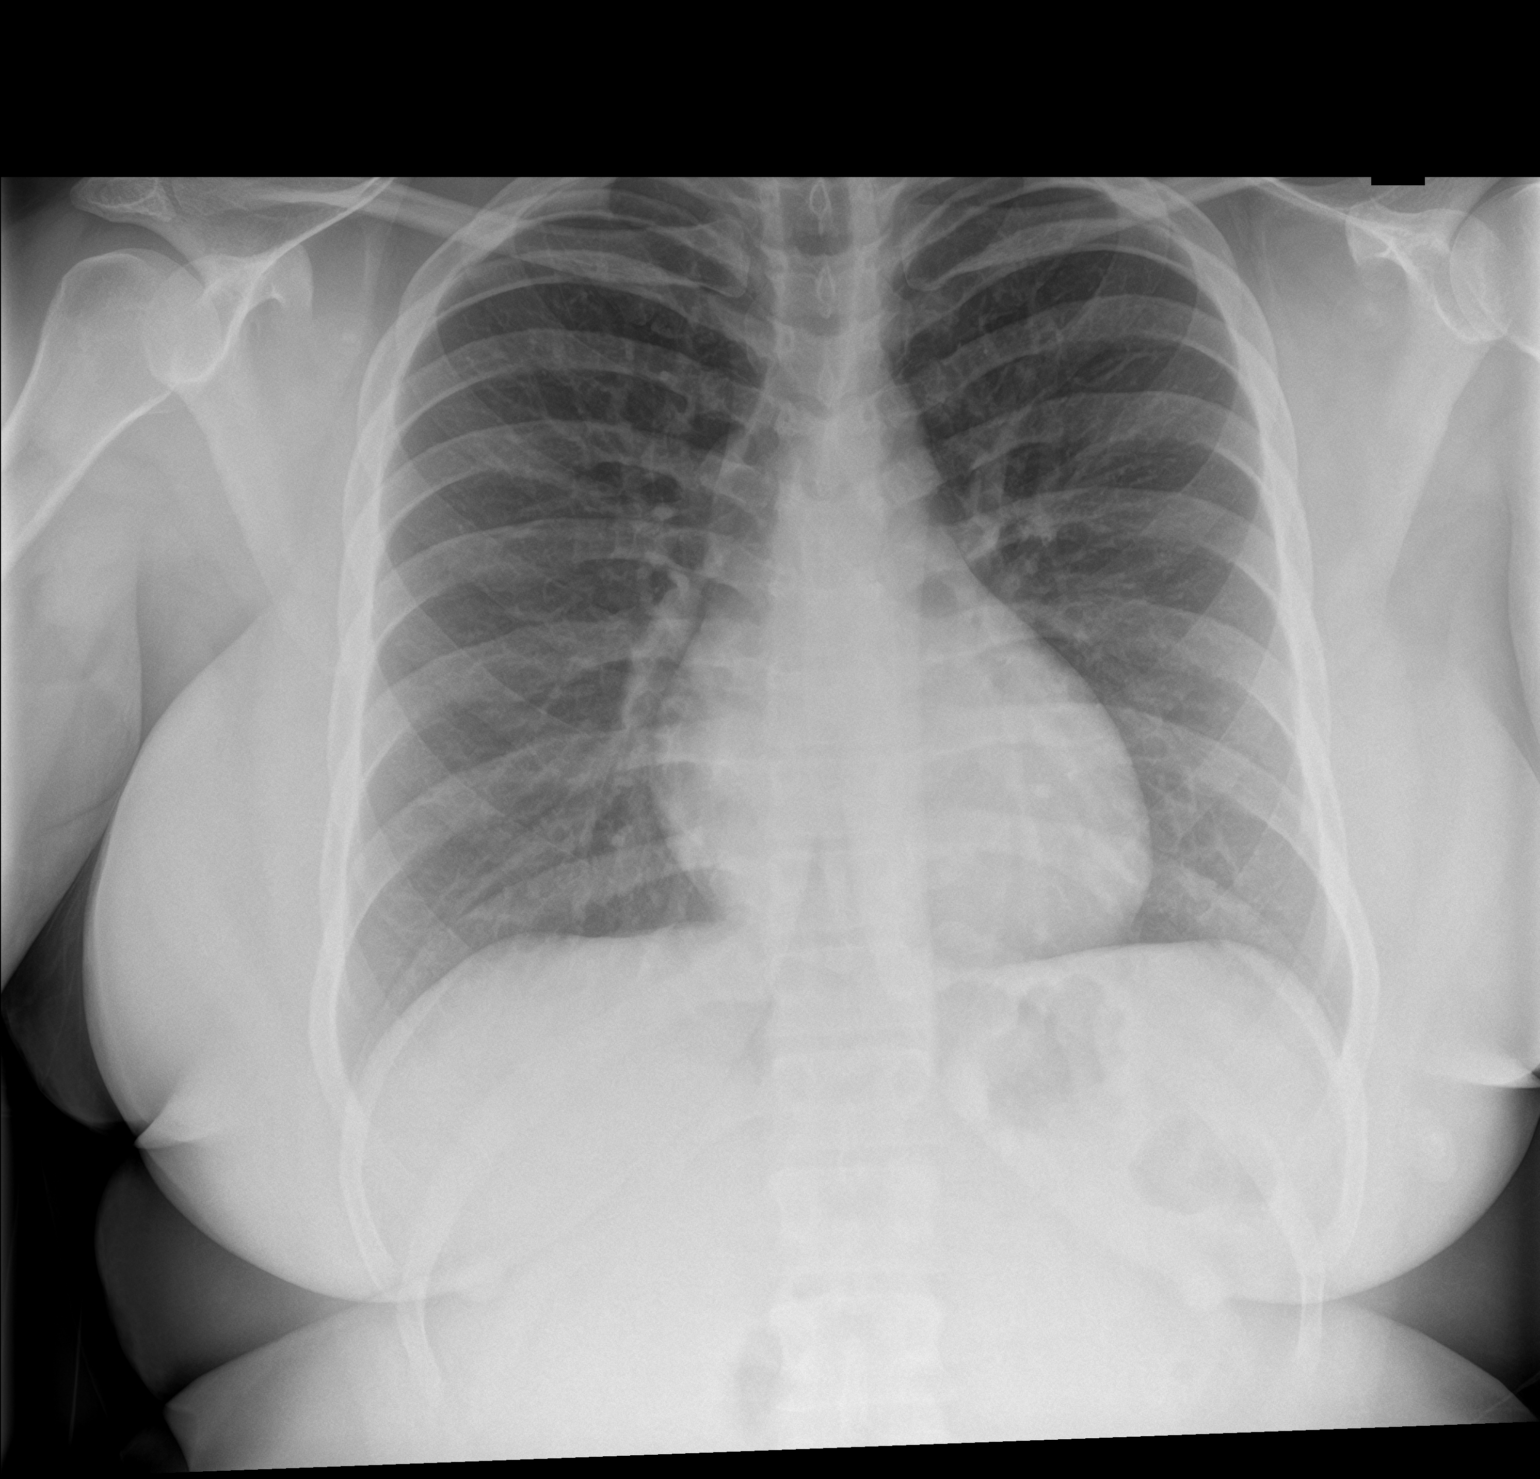

[chest lat]
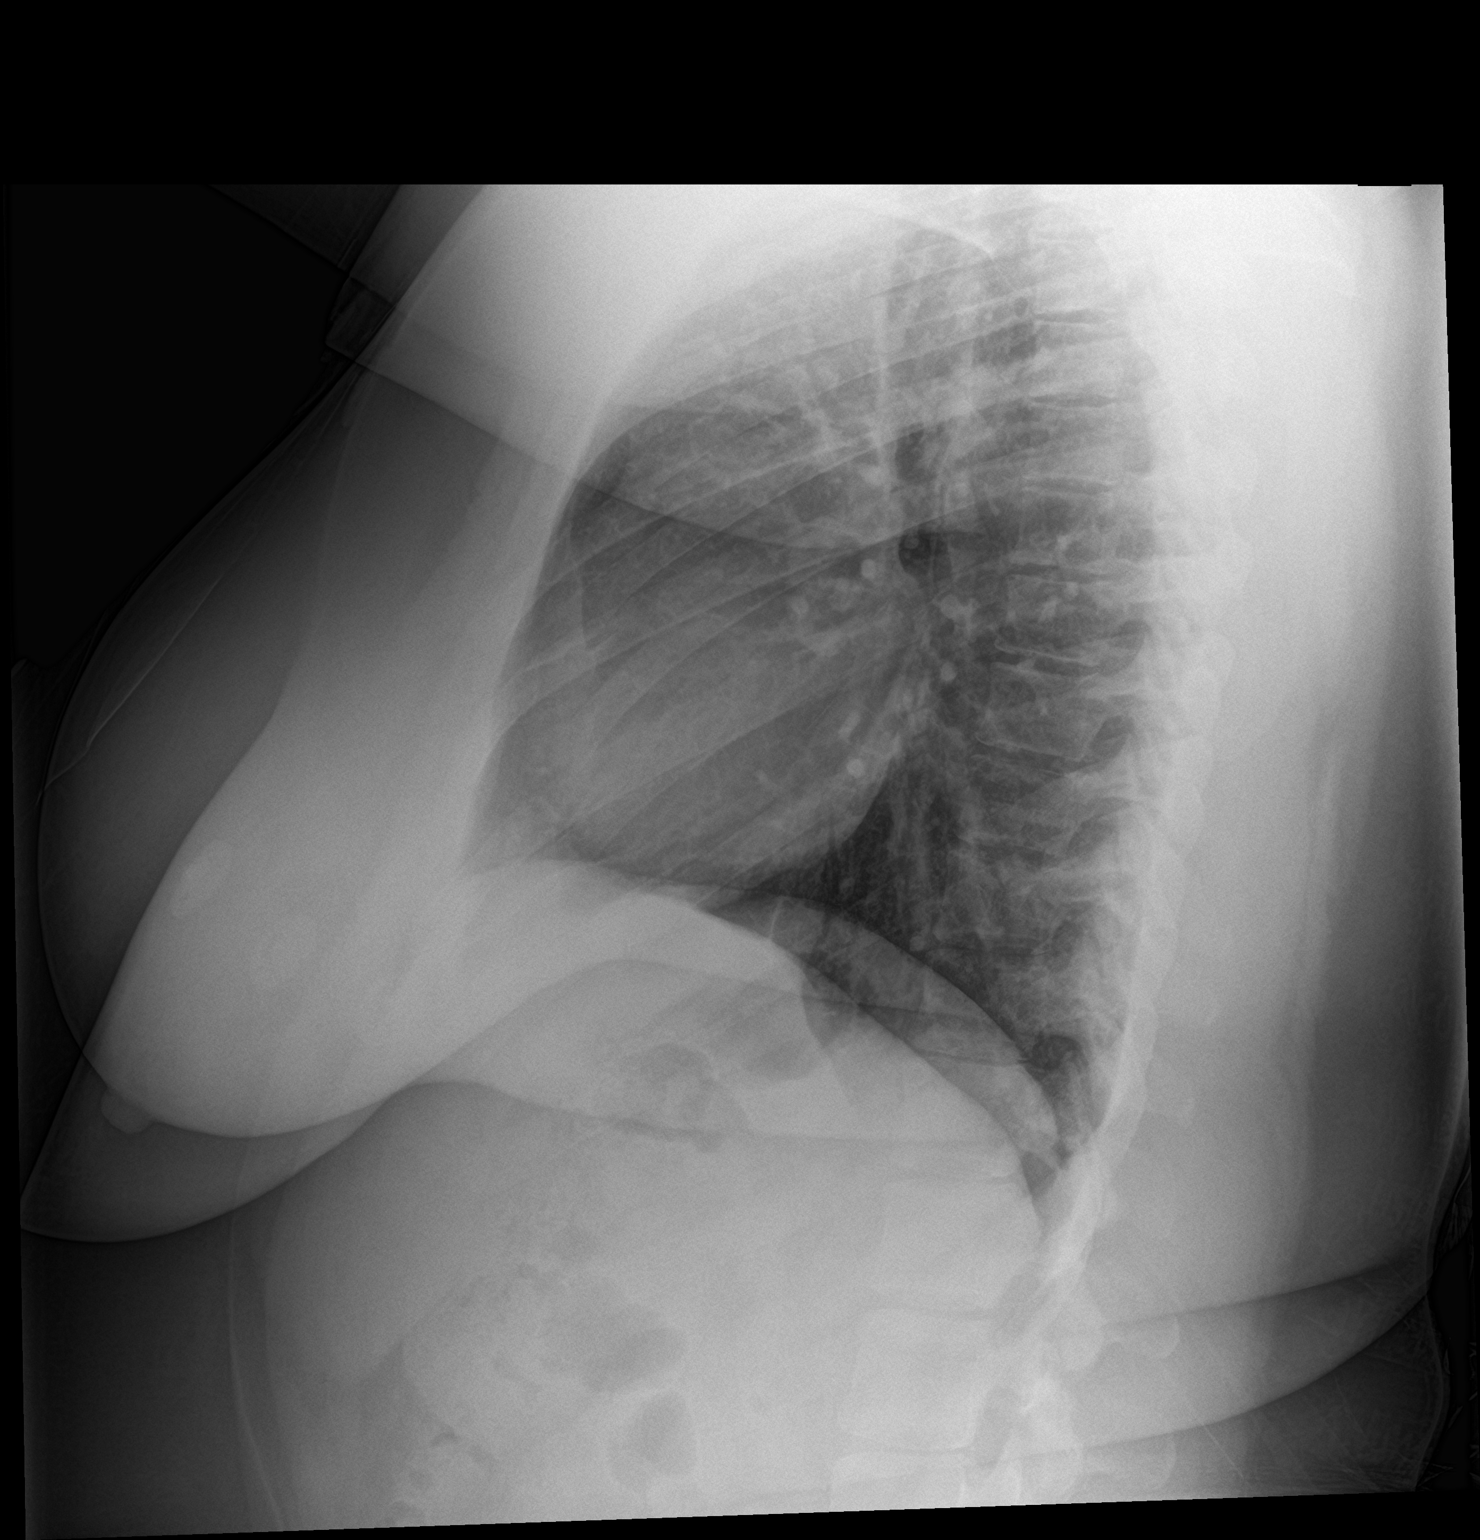

[2 of 2 positions shown; findings below may reference images not displayed]

FINDINGS: The cardiomediastinal silhouette is normal in contour. No pleural
effusion. No pneumothorax. No acute pleuroparenchymal abnormality.
Visualized abdomen is unremarkable. No acute osseous abnormality
noted.
IMPRESSION: No acute cardiopulmonary abnormality.

## 2021-01-07 ENCOUNTER — Encounter: Payer: Self-pay | Admitting: Nurse Practitioner

## 2021-01-07 ENCOUNTER — Ambulatory Visit (INDEPENDENT_AMBULATORY_CARE_PROVIDER_SITE_OTHER): Payer: Self-pay | Admitting: Nurse Practitioner

## 2021-01-07 ENCOUNTER — Other Ambulatory Visit: Payer: Self-pay

## 2021-01-07 VITALS — BP 139/87 | HR 90 | Temp 97.7°F | Ht 63.0 in | Wt 238.0 lb

## 2021-01-07 DIAGNOSIS — N939 Abnormal uterine and vaginal bleeding, unspecified: Secondary | ICD-10-CM | POA: Insufficient documentation

## 2021-01-07 NOTE — Assessment & Plan Note (Signed)
-  will get Nuswab and pregnancy test -has new female sexual partner -referral to GYN

## 2021-01-07 NOTE — Progress Notes (Signed)
Acute Office Visit  Subjective:    Patient ID: Christy Goodman, female    DOB: [birth year], 25 y.o.   MRN: [id number]  Chief Complaint  Patient presents with   Menstrual Problem    Spotting since last Monday, has had big clots of blood several times. Had spotting last month as well and one episode of vomiting.     HPI Patient is in today for spotting between periods. LMP 12/01/20. She states she vomited about a month ago and had fatigue when she was in [place] for her cousin's funeral duties. She had spotting at that time.    Last Monday, she had spotting and had cramping. She took midol, and on Tuesday and Wednesday she had clots on her tampon. She states she has had heavy periods since menarche.  She has a new female sexual partner, but has been using condoms.  Past Medical History:  Diagnosis Date   Allergies    Asthma    Gonorrhea 06/06/2016   Gonorrhea 06/06/2016   H/O seasonal allergies    Hypertension     History reviewed. No pertinent surgical history.  Family History  Problem Relation Age of Onset   Cancer Paternal Grandmother    Diabetes Paternal Grandmother    Diabetes Paternal Aunt     Social History   Socioeconomic History   Marital status: Single    Spouse name: Not on file   Number of children: Not on file   Years of education: Not on file   Highest education level: Not on file  Occupational History   Not on file  Tobacco Use   Smoking status: Some Days    Packs/day: 0.25    Years: 1.00    Pack years: 0.25    Types: Cigarettes, Cigars   Smokeless tobacco: Never  Vaping Use   Vaping Use: Never used  Substance and Sexual Activity   Alcohol use: Yes    Comment: occ; tequila   Drug use: Yes    Types: Marijuana    Comment: 3 joints per day   Sexual activity: Yes    Birth control/protection: None    Comment: sexual partner is female- no chance of pregnancy  Other Topics Concern   Not on file  Social History Narrative   Not on file   Social  Determinants of Health   Financial Resource Strain: Not on file  Food Insecurity: Not on file  Transportation Needs: Not on file  Physical Activity: Not on file  Stress: Not on file  Social Connections: Not on file  Intimate Partner Violence: Not on file    Outpatient Medications Prior to Visit  Medication Sig Dispense Refill   albuterol (VENTOLIN HFA) 108 (90 Base) MCG/ACT inhaler Inhale 2 puffs into the lungs every 4 (four) hours as needed for wheezing or shortness of breath.      lisinopril (ZESTRIL) 5 MG tablet Take 1 tablet (5 mg total) by mouth daily. 90 tablet 1   ondansetron (ZOFRAN-ODT) 4 MG disintegrating tablet Take 1 tablet (4 mg total) by mouth every 8 (eight) hours as needed for nausea or vomiting. 20 tablet 0   No facility-administered medications prior to visit.    No Known Allergies  Review of Systems  Constitutional: Negative.   Respiratory: Negative.    Cardiovascular: Negative.   Genitourinary:  Positive for vaginal bleeding.      Objective:    Physical Exam Constitutional:      Appearance: Normal appearance. She is obese.  Cardiovascular:     Rate and Rhythm: Normal rate and regular rhythm.     Pulses: Normal pulses.     Heart sounds: Normal heart sounds.  Pulmonary:     Effort: Pulmonary effort is normal.     Breath sounds: Normal breath sounds.  Genitourinary:    Comments: Nuswab completed Neurological:     Mental Status: She is alert.    BP 139/87 (BP Location: Left Arm, Patient Position: Sitting, Cuff Size: Large)   Pulse 90   Temp 97.7 F (36.5 C) (Oral)   Ht '5\' 3"'$  (1.6 m)   Wt 238 lb (108 kg)   LMP 11/24/2020 (Exact Date)   SpO2 97%   BMI 42.16 kg/m  Wt Readings from Last 3 Encounters:  01/07/21 238 lb (108 kg)  08/31/20 240 lb (108.9 kg)  06/30/20 241 lb (109.3 kg)    Health Maintenance Due  Topic Date Due   INFLUENZA VACCINE  12/27/2020    There are no preventive care reminders to display for this patient.   Lab  Results  Component Value Date   TSH 1.550 06/21/2020   Lab Results  Component Value Date   WBC 6.4 06/21/2020   HGB 12.6 06/21/2020   HCT 36.4 06/21/2020   MCV 84 06/21/2020   PLT 326 06/21/2020   Lab Results  Component Value Date   NA 143 06/21/2020   K 3.9 06/21/2020   CO2 21 06/21/2020   GLUCOSE 95 06/21/2020   BUN 8 06/21/2020   CREATININE 0.80 06/21/2020   BILITOT 0.2 06/21/2020   ALKPHOS 55 06/21/2020   AST 32 06/21/2020   ALT 49 (H) 06/21/2020   PROT 6.7 06/21/2020   ALBUMIN 4.3 06/21/2020   CALCIUM 8.8 06/21/2020   ANIONGAP 7 06/05/2020   Lab Results  Component Value Date   CHOL 112 06/21/2020   Lab Results  Component Value Date   HDL 36 (L) 06/21/2020   Lab Results  Component Value Date   LDLCALC 53 06/21/2020   Lab Results  Component Value Date   TRIG 130 06/21/2020   No results found for: CHOLHDL No results found for: HGBA1C     Assessment & Plan:   Problem List Items Addressed This Visit       Genitourinary   Abnormal uterine bleeding (AUB) - Primary    -will get Nuswab and pregnancy test -has new female sexual partner -referral to GYN      Relevant Orders   Ambulatory referral to Gynecology   NuSwab Vaginitis Plus (VG+)   Beta HCG, Quant     No orders of the defined types were placed in this encounter.    Noreene Larsson, NP

## 2021-01-08 LAB — BETA HCG QUANT (REF LAB): hCG Quant: <1 m[IU]/mL

## 2021-01-10 ENCOUNTER — Other Ambulatory Visit: Payer: Self-pay | Admitting: Nurse Practitioner

## 2021-01-10 NOTE — Progress Notes (Signed)
Negative for pregnancy. Nuswab still being processed.

## 2021-01-11 ENCOUNTER — Telehealth: Payer: Self-pay

## 2021-01-11 ENCOUNTER — Other Ambulatory Visit: Payer: Self-pay | Admitting: Nurse Practitioner

## 2021-01-11 LAB — NUSWAB VAGINITIS PLUS (VG+)
Atopobium vaginae: HIGH Score — AB
BVAB 2: HIGH Score — AB
Candida albicans, NAA: NEGATIVE
Candida glabrata, NAA: NEGATIVE
Chlamydia trachomatis, NAA: NEGATIVE
Megasphaera 1: HIGH Score — AB
Neisseria gonorrhoeae, NAA: NEGATIVE
Trich vag by NAA: NEGATIVE

## 2021-01-11 MED ORDER — CLINDAMYCIN PHOSPHATE 2 % VA CREA: 1.0000 | TOPICAL_CREAM | Freq: Every day | VAGINAL | 0 refills | Status: AC

## 2021-01-11 MED ORDER — METRONIDAZOLE 0.75 % EX GEL: 1.0000 "application " | Freq: Two times a day (BID) | CUTANEOUS | 0 refills | Status: DC

## 2021-01-11 NOTE — Telephone Encounter (Signed)
Sent in clindamycin cream for her. I don't see boric acid in the treatment guidelines for BV. It probably would work, but I'm going off the guideline recommendations for her treatment.

## 2021-01-11 NOTE — Telephone Encounter (Signed)
Patient called asked if Boric Acid pill will be good for her to take she does not do well with the Metrogel gel?  Please call patient early morning Wednesday before she lays down. Call back # [phone]

## 2021-01-11 NOTE — Progress Notes (Signed)
Nuswab was positive for BV, which is a bacterial overgrowth, not an STD. I sent in metronidazole vaginal gel to help resolve the issue. She should still f/u with GYN.

## 2021-01-11 NOTE — Progress Notes (Signed)
-  she called and states that flagyl gel doesn't well for her -Rx. Vaginal Clindamycin cream for BV

## 2021-01-11 NOTE — Telephone Encounter (Signed)
Patient called about lab results. 

## 2021-01-12 NOTE — Telephone Encounter (Signed)
Pt informed

## 2021-01-20 ENCOUNTER — Encounter: Payer: Self-pay | Admitting: Obstetrics & Gynecology

## 2021-02-03 ENCOUNTER — Other Ambulatory Visit: Payer: Self-pay

## 2021-02-03 ENCOUNTER — Encounter: Payer: Self-pay | Admitting: Obstetrics & Gynecology

## 2021-02-03 ENCOUNTER — Ambulatory Visit (INDEPENDENT_AMBULATORY_CARE_PROVIDER_SITE_OTHER): Payer: Self-pay | Admitting: Obstetrics & Gynecology

## 2021-02-03 VITALS — BP 126/92 | HR 81 | Ht 63.0 in | Wt 236.0 lb

## 2021-02-03 DIAGNOSIS — N938 Other specified abnormal uterine and vaginal bleeding: Secondary | ICD-10-CM

## 2021-02-03 MED ORDER — MEGESTROL ACETATE 40 MG PO TABS: ORAL_TABLET | ORAL | 0 refills | Status: DC

## 2021-02-03 NOTE — Progress Notes (Signed)
Chief Complaint  Patient presents with   Abnormal uterine bleeding    Heavy bleeding and clots      25 y.o. G0P0000 Patient's last menstrual period was 01/03/2021. The current method of family planning is none.  Outpatient Encounter Medications as of 02/03/2021  Medication Sig   albuterol (VENTOLIN HFA) 108 (90 Base) MCG/ACT inhaler Inhale 2 puffs into the lungs every 4 (four) hours as needed for wheezing or shortness of breath.    lisinopril (ZESTRIL) 5 MG tablet TAKE ONE TABLET ('5MG'$  TOTAL) BY MOUTH DAILY   megestrol (MEGACE) 40 MG tablet 3 tablets a day for 5 days, 2 tablets a day for 5 days then 1 tablet daily   ondansetron (ZOFRAN-ODT) 4 MG disintegrating tablet Take 1 tablet (4 mg total) by mouth every 8 (eight) hours as needed for nausea or vomiting. (Patient not taking: Reported on 02/03/2021)   No facility-administered encounter medications on file as of 02/03/2021.    Subjective Pt has been bleeding for 1 month Has never done this except when she began periods in the eight grade No cramps Some clots and heavy bleeding Now daily spotting No BCM No recent covid No antibiotics Past Medical History:  Diagnosis Date   Allergies    Asthma    Gonorrhea 06/06/2016   Gonorrhea 06/06/2016   H/O seasonal allergies    Hypertension     History reviewed. No pertinent surgical history.  OB History     Gravida  0   Para  0   Term  0   Preterm  0   AB  0   Living  0      SAB  0   IAB  0   Ectopic  0   Multiple  0   Live Births  0           No Known Allergies  Social History   Socioeconomic History   Marital status: Single    Spouse name: Not on file   Number of children: Not on file   Years of education: Not on file   Highest education level: Not on file  Occupational History   Not on file  Tobacco Use   Smoking status: Former    Packs/day: 0.25    Years: 1.00    Pack years: 0.25    Types: Cigarettes, Cigars   Smokeless tobacco:  Never  Vaping Use   Vaping Use: Never used  Substance and Sexual Activity   Alcohol use: Yes    Comment: occ; tequila   Drug use: Yes    Types: Marijuana    Comment: 3 joints per day   Sexual activity: Yes    Birth control/protection: None    Comment: sexual partner is female- no chance of pregnancy  Other Topics Concern   Not on file  Social History Narrative   Not on file   Social Determinants of Health   Financial Resource Strain: Low Risk    Difficulty of Paying Living Expenses: Not hard at all  Food Insecurity: No Food Insecurity   Worried About Charity fundraiser in the Last Year: Never true   Ran Out of Food in the Last Year: Never true  Transportation Needs: No Transportation Needs   Lack of Transportation (Medical): No   Lack of Transportation (Non-Medical): No  Physical Activity: Sufficiently Active   Days of Exercise per Week: 5 days   Minutes of Exercise per Session: 30 min  Stress:  No Stress Concern Present   Feeling of Stress : Not at all  Social Connections: Socially Isolated   Frequency of Communication with Friends and Family: More than three times a week   Frequency of Social Gatherings with Friends and Family: Twice a week   Attends Religious Services: Never   Marine scientist or Organizations: No   Attends Music therapist: Never   Marital Status: Never married    Family History  Problem Relation Age of Onset   Cancer Paternal Grandmother    Diabetes Paternal Grandmother    Diabetes Paternal Aunt     Medications:       Current Outpatient Medications:    albuterol (VENTOLIN HFA) 108 (90 Base) MCG/ACT inhaler, Inhale 2 puffs into the lungs every 4 (four) hours as needed for wheezing or shortness of breath. , Disp: , Rfl:    lisinopril (ZESTRIL) 5 MG tablet, TAKE ONE TABLET ('5MG'$  TOTAL) BY MOUTH DAILY, Disp: 90 tablet, Rfl: 1   megestrol (MEGACE) 40 MG tablet, 3 tablets a day for 5 days, 2 tablets a day for 5 days then 1 tablet  daily, Disp: 45 tablet, Rfl: 0   ondansetron (ZOFRAN-ODT) 4 MG disintegrating tablet, Take 1 tablet (4 mg total) by mouth every 8 (eight) hours as needed for nausea or vomiting. (Patient not taking: Reported on 02/03/2021), Disp: 20 tablet, Rfl: 0  Objective Blood pressure (!) 126/92, pulse 81, height '5\' 3"'$  (1.6 m), weight 236 lb (107 kg), last menstrual period 01/03/2021.  General WDWN female NAD Vulva:  normal appearing vulva with no masses, tenderness or lesions Vagina:  normal mucosa, no discharge Cervix:  Normal no lesions Uterus:  normal size, contour, position, consistency, mobility, non-tender Adnexa: ovaries:present,  normal adnexa in size, nontender and no masses   Pertinent ROS No burning with urination, frequency or urgency No nausea, vomiting or diarrhea Nor fever chills or other constitutional symptoms   Labs or studies Hemoglobin 12.7    Impression Diagnoses this Encounter::   ICD-10-CM   1. DUB (dysfunctional uterine bleeding)  N93.8    megace algorithm for 1 month      Established relevant diagnosis(es):   Plan/Recommendations: Meds ordered this encounter  Medications   megestrol (MEGACE) 40 MG tablet    Sig: 3 tablets a day for 5 days, 2 tablets a day for 5 days then 1 tablet daily    Dispense:  45 tablet    Refill:  0    Labs or Scans Ordered: No orders of the defined types were placed in this encounter.   Management:: As above  Follow up Return if symptoms worsen or fail to improve.     All questions were answered.

## 2021-06-12 IMAGING — US US ABDOMEN LIMITED
1 series · 14 of 25 positions shown · non-contrast
Comparison: None.

CLINICAL DATA: RIGHT upper quadrant pain today.

EXAM:
ULTRASOUND ABDOMEN LIMITED RIGHT UPPER QUADRANT

[Series 1: us abdomen limited ruq (liver/gb) · 14 of 48 slices shown]
[im 1/48]
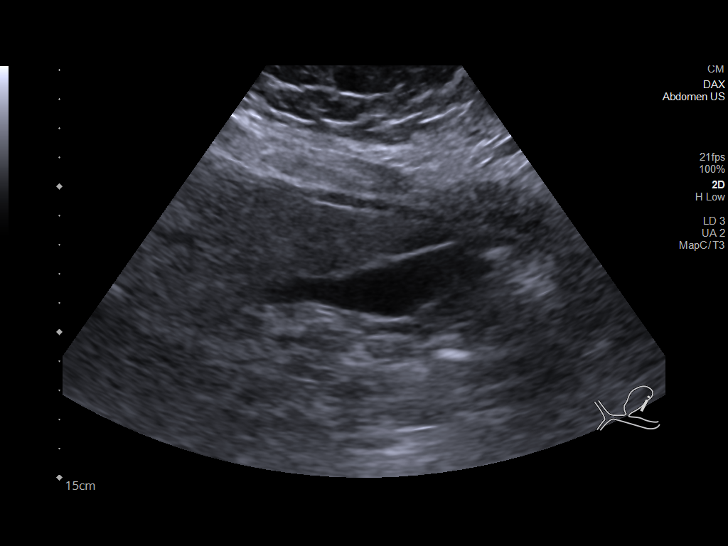
[im 4/48]
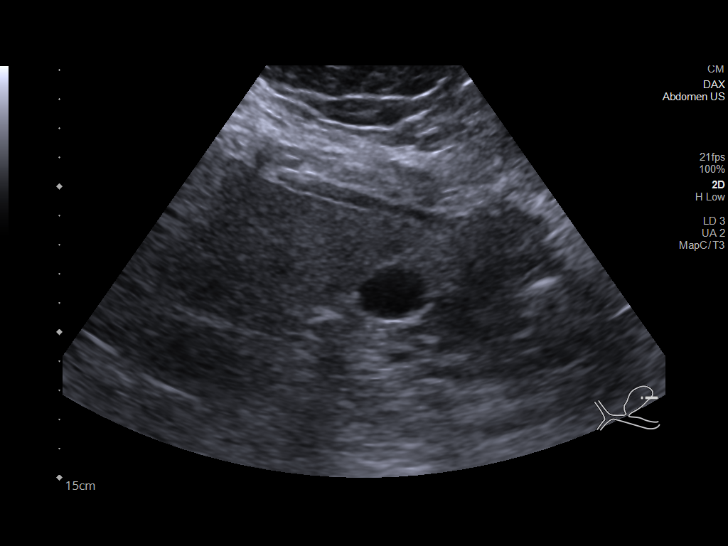
[im 8/48]
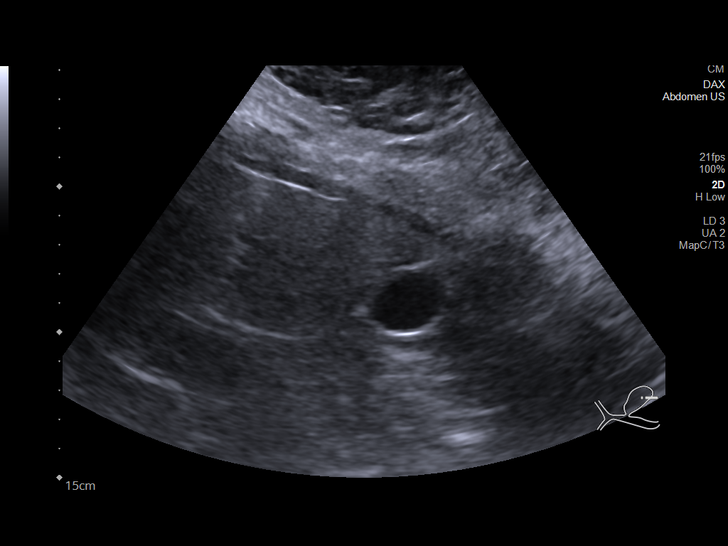
[im 12/48]
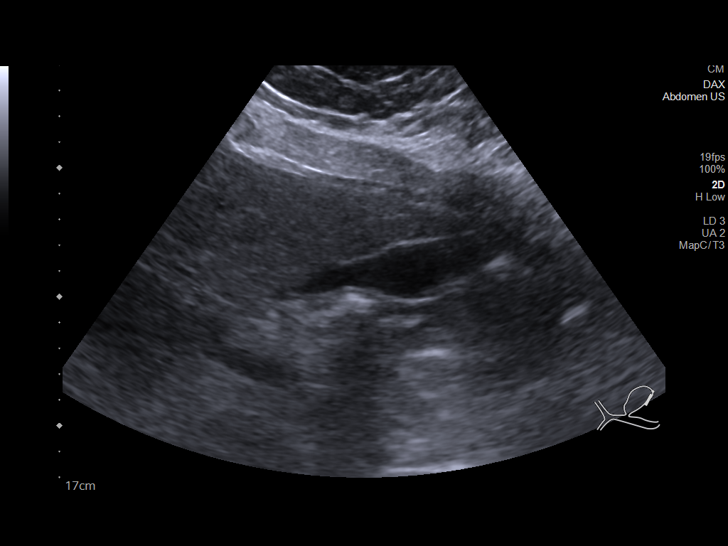
[im 16/48]
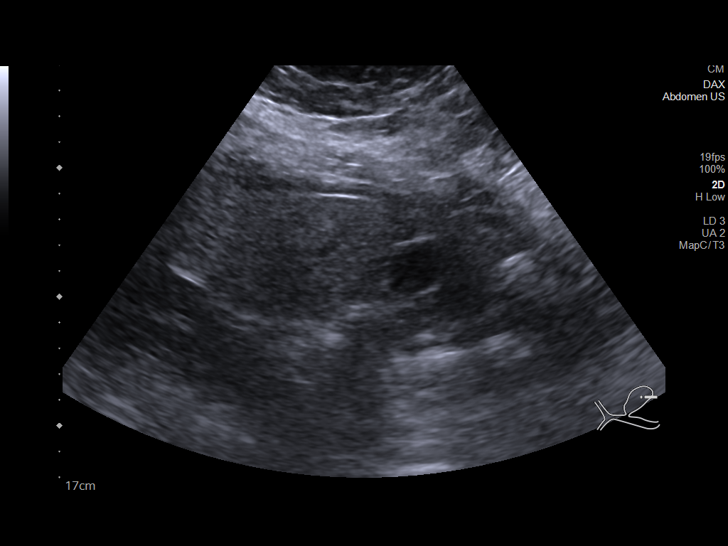
[im 18/48]
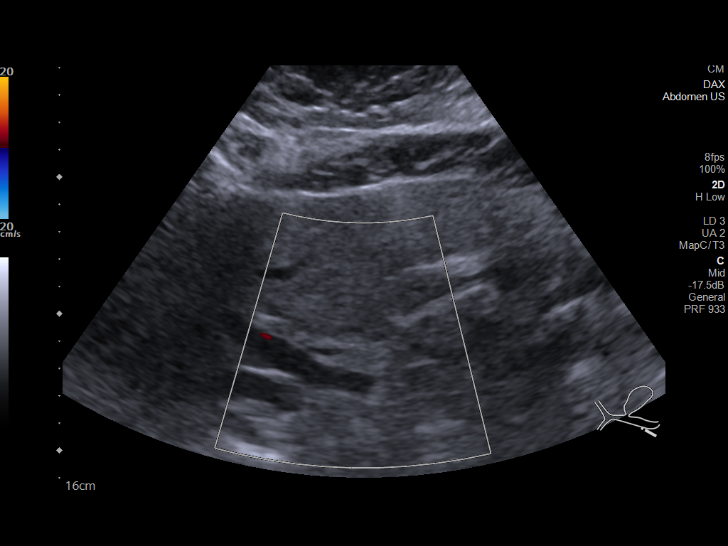
[im 22/48]
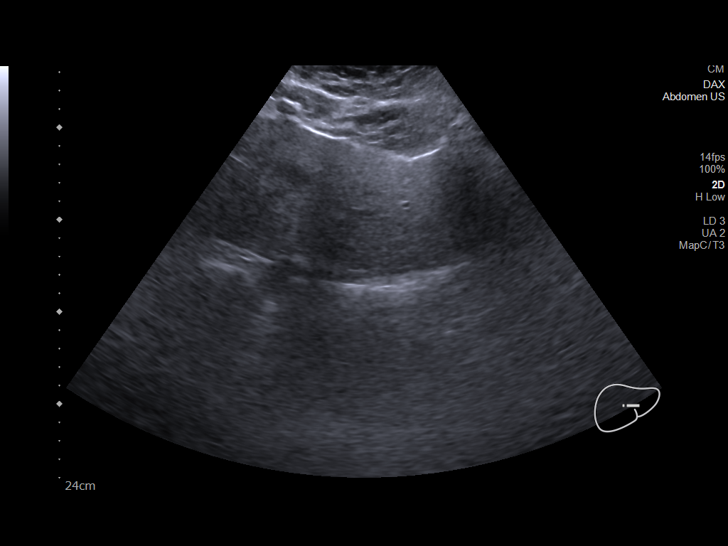
[im 26/48]
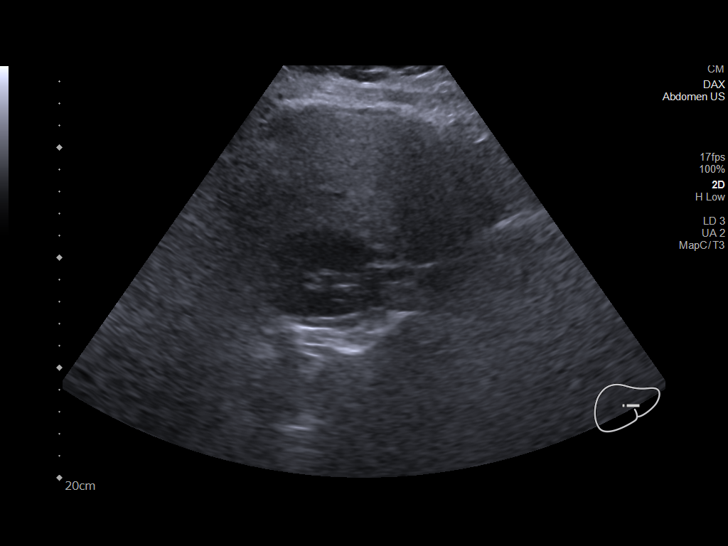
[im 30/48]
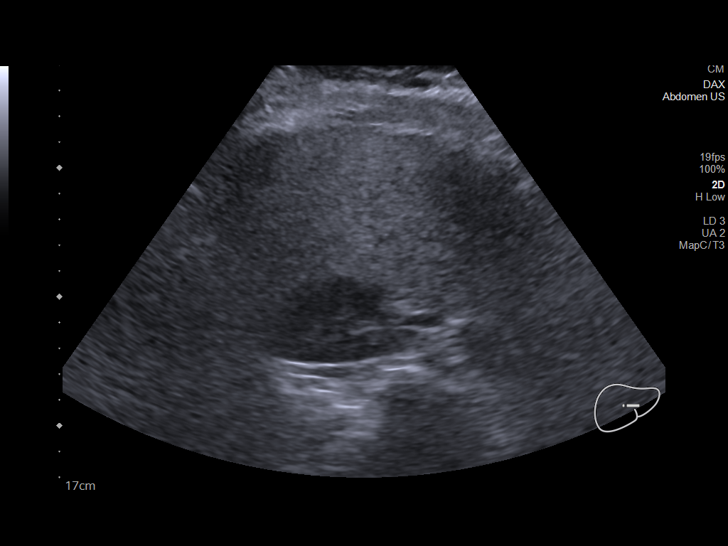
[im 32/48]
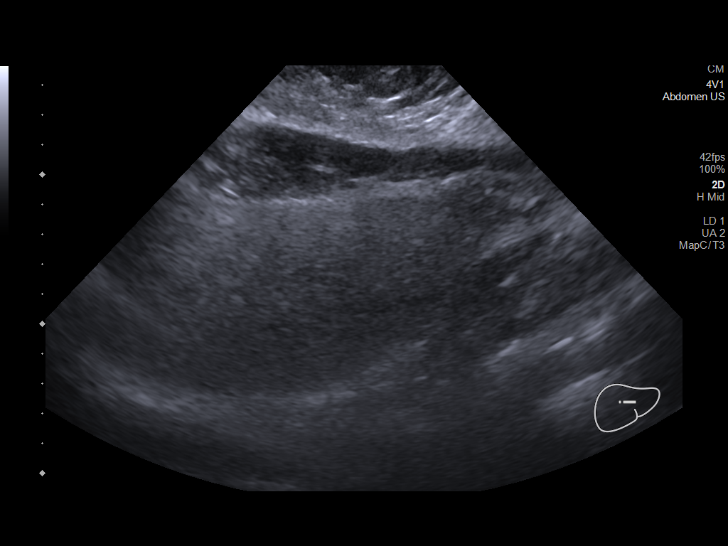
[im 36/48]
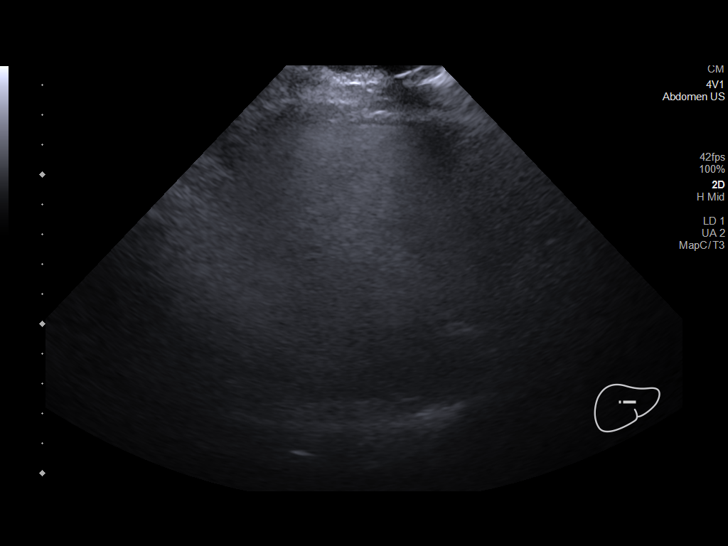
[im 40/48]
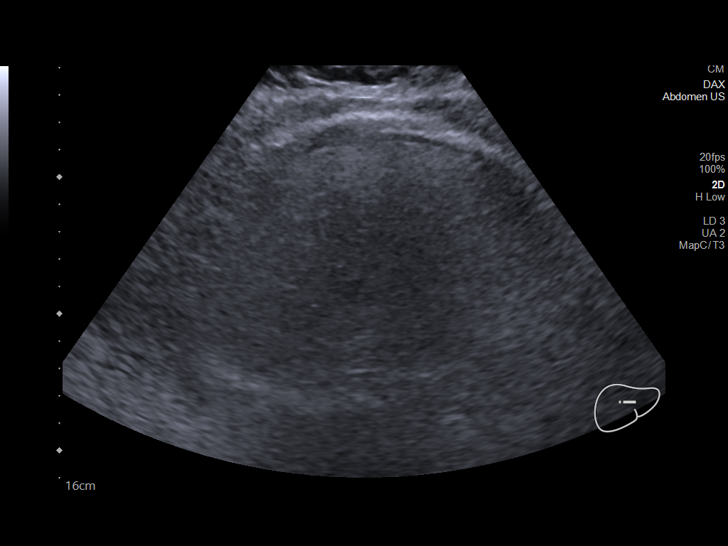
[im 44/48]
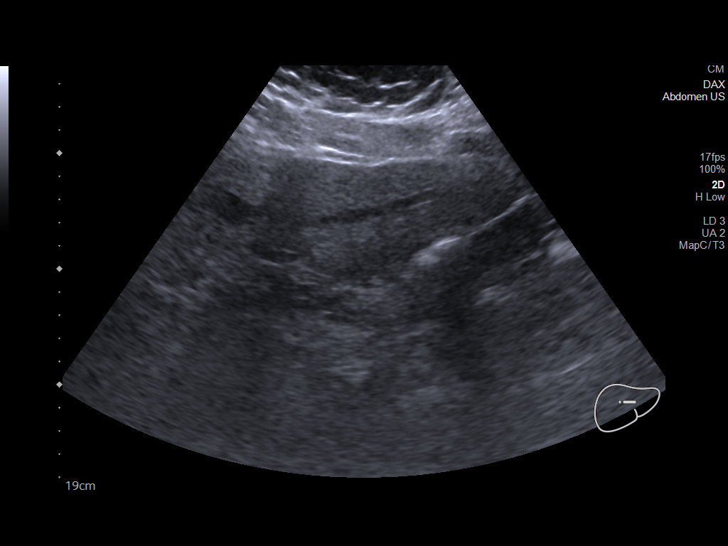
[im 48/48]
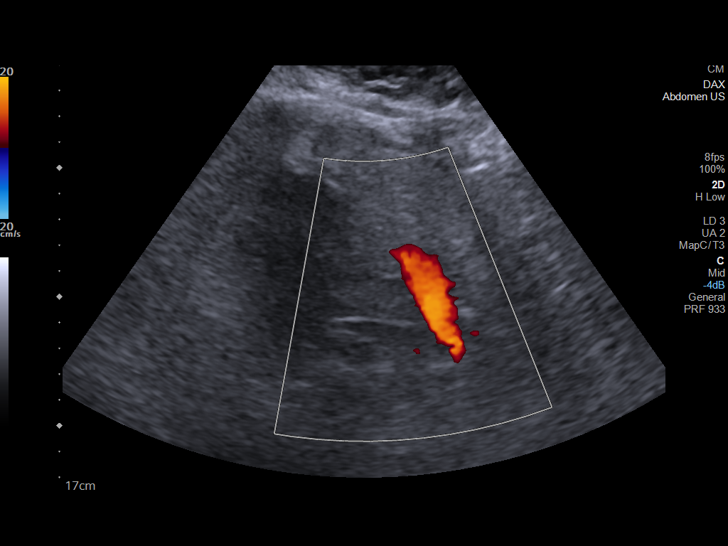

[14 of 25 positions shown; findings below may reference images not displayed]

FINDINGS: Gallbladder:

No gallstones or wall thickening visualized. No sonographic Murphy
sign noted by sonographer.

Common bile duct:

Diameter: 4 mm

Liver:

Liver is diffusely echogenic indicating fatty infiltration. No focal
mass or lesion is demonstrated within the liver. Portal vein is
patent on color Doppler imaging with normal direction of blood flow
towards the liver.

Other: None.
IMPRESSION: 1. No acute findings.  No gallstones.  No evidence of cholecystitis.
2. Fatty infiltration of the liver.

## 2021-06-12 IMAGING — DX DG CHEST 2V
2 series · 2 of 2 positions shown · non-contrast
Comparison: 12/29/2019

CLINICAL DATA: Chest pain

EXAM:
CHEST - 2 VIEW

[chest pa]
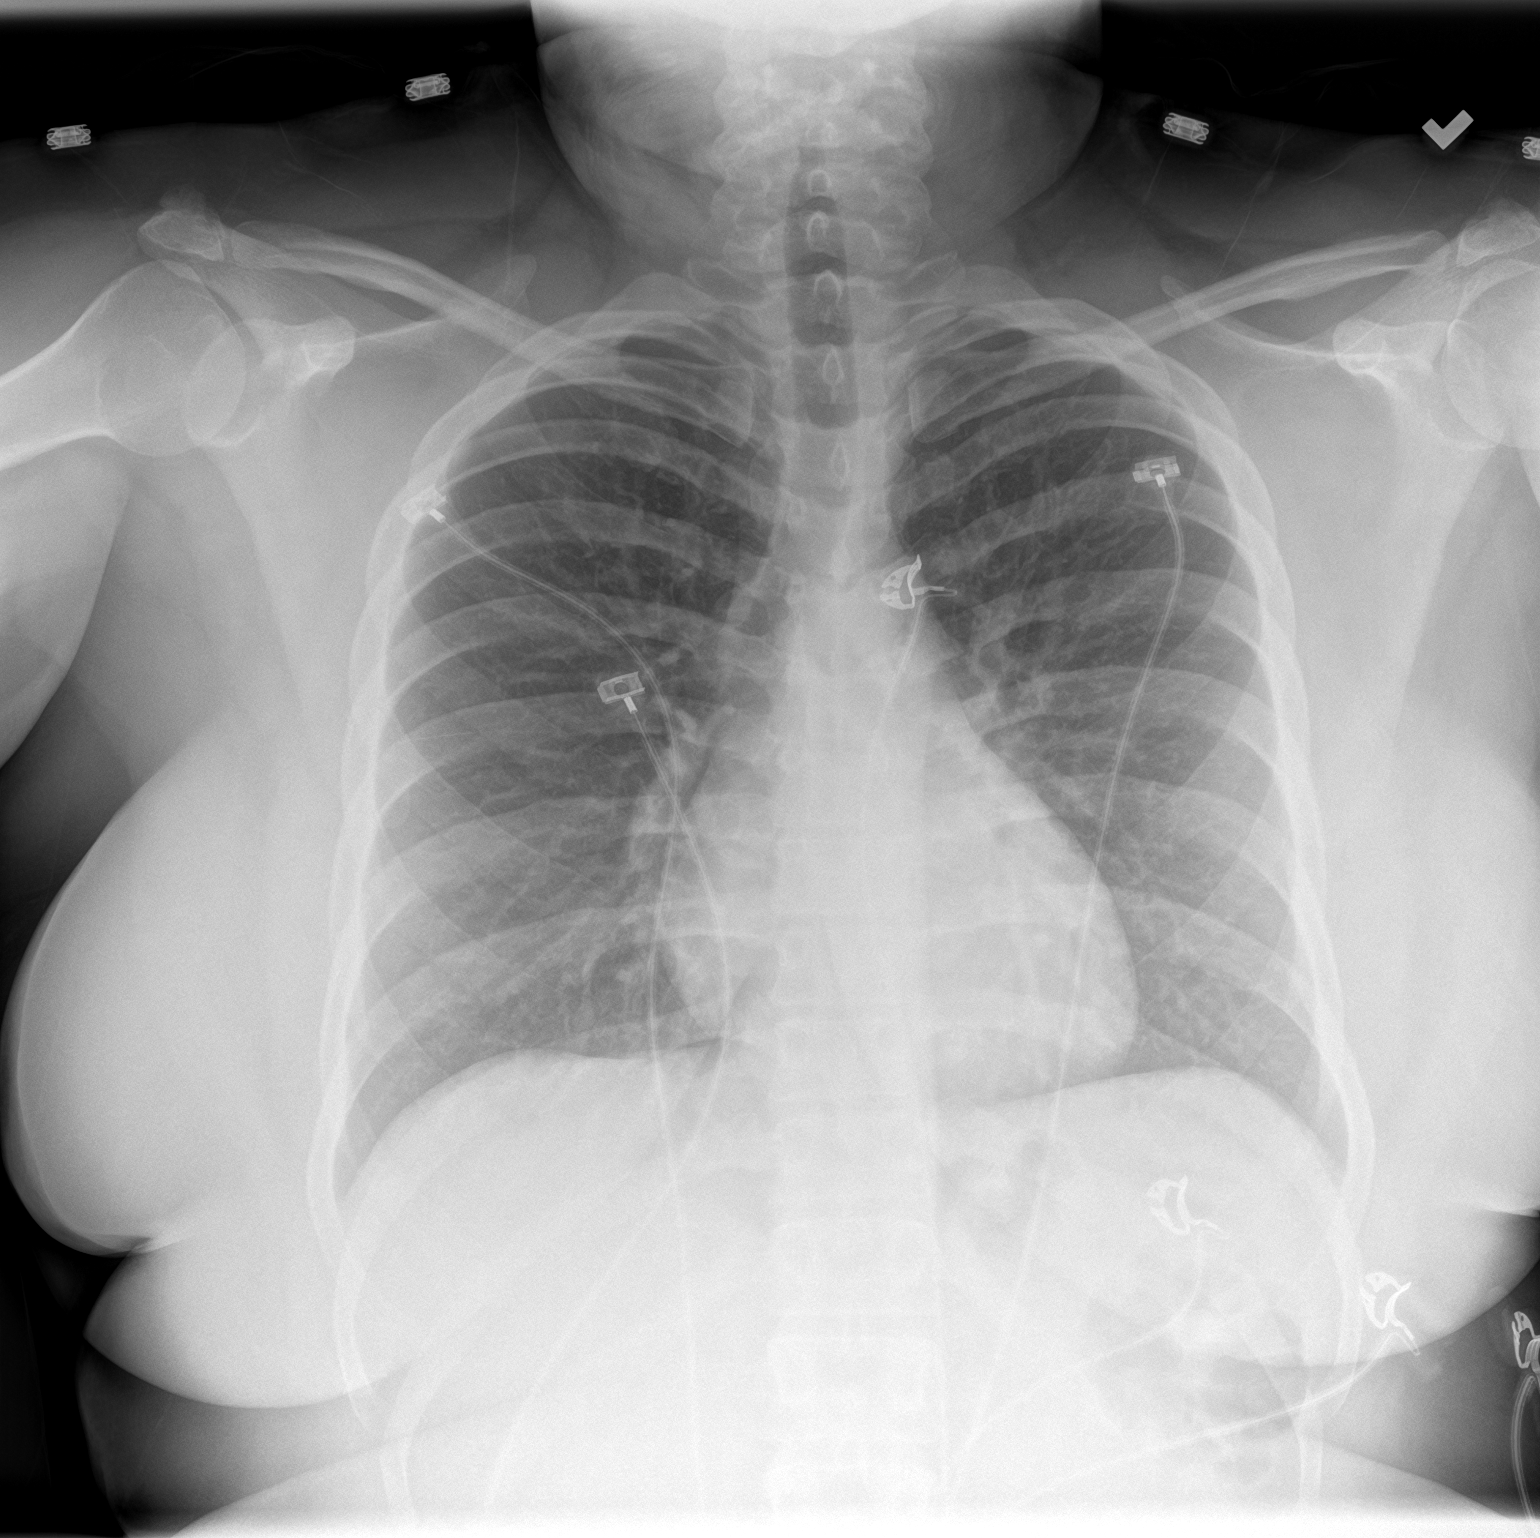

[chest lat]
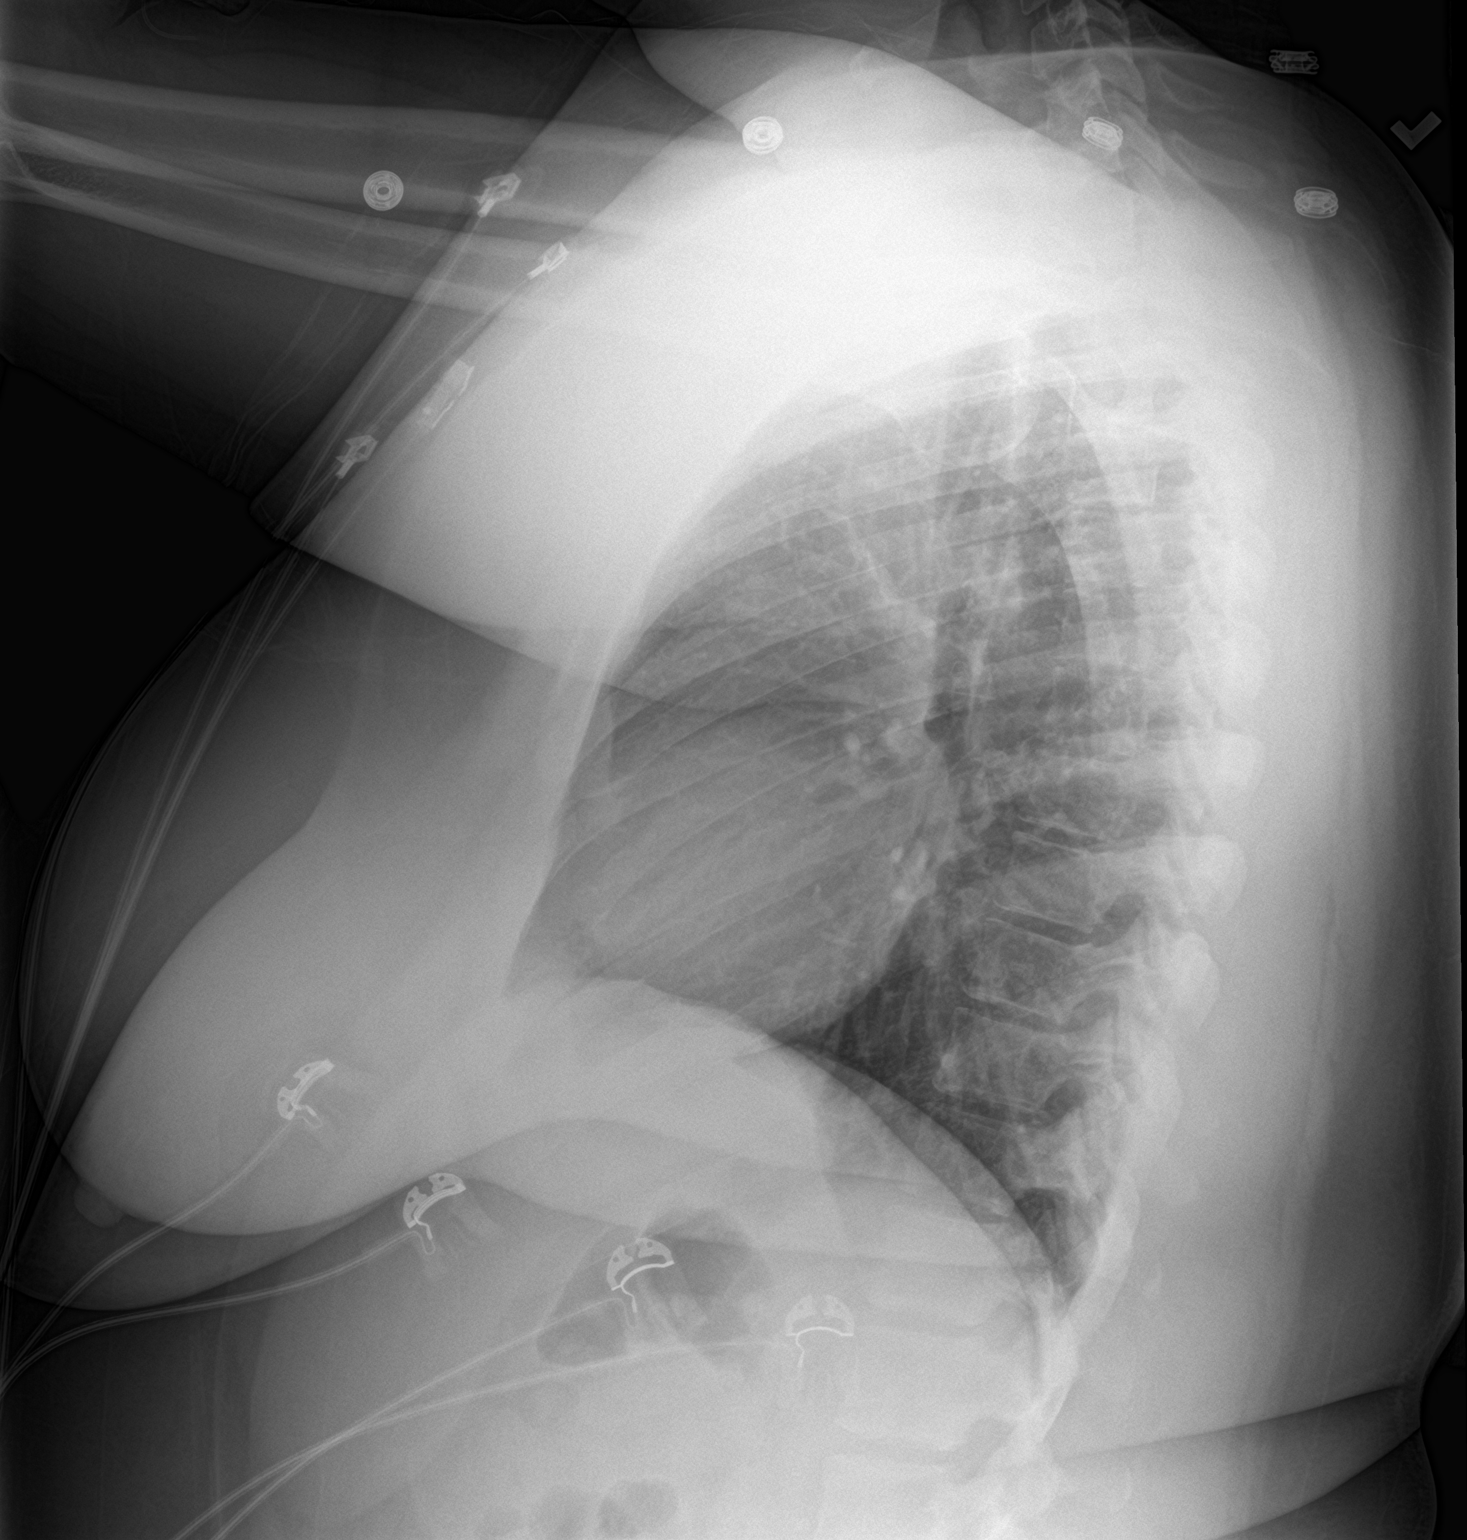

[2 of 2 positions shown; findings below may reference images not displayed]

FINDINGS: The heart size and mediastinal contours are within normal limits.
Both lungs are clear. No pleural effusion or pneumothorax. The
visualized skeletal structures are unremarkable.
IMPRESSION: No acute process in the chest.

## 2021-07-13 ENCOUNTER — Telehealth: Payer: Self-pay | Admitting: Nurse Practitioner

## 2021-07-13 ENCOUNTER — Other Ambulatory Visit: Payer: Self-pay | Admitting: Nurse Practitioner

## 2021-07-13 MED ORDER — LISINOPRIL 5 MG PO TABS: ORAL_TABLET | ORAL | 1 refills | Status: DC

## 2021-07-13 NOTE — Telephone Encounter (Signed)
Pt needs refill on   lisinopril (ZESTRIL) 5 MG tablet

## 2021-07-13 NOTE — Telephone Encounter (Signed)
Pt last seen august of 2022 does not have a scheduled appt. Does pt need an appt scheduled or can I process refill. Please advise.

## 2021-07-13 NOTE — Telephone Encounter (Signed)
Spoke with pt scheduled cpe

## 2021-08-16 ENCOUNTER — Encounter: Payer: 59 | Admitting: Nurse Practitioner

## 2021-10-18 ENCOUNTER — Telehealth: Payer: Self-pay

## 2021-10-18 ENCOUNTER — Other Ambulatory Visit: Payer: Self-pay | Admitting: Nurse Practitioner

## 2021-10-18 MED ORDER — LISINOPRIL 5 MG PO TABS: ORAL_TABLET | ORAL | 1 refills | Status: DC

## 2021-10-18 NOTE — Telephone Encounter (Signed)
Patient called completely out of blood pressures meds can enough be called into her pharmacy until her next appt can not afford to leave work she will loose her job. She just started a new job. Can not have an appt scheduled til after 3:00 pm   lisinopril (ZESTRIL) 5 MG tablet   Pharmacy  [facility] [address]  [place], [place] [address]  Phone:  [phone]  Fax:  [phone]

## 2021-10-18 NOTE — Telephone Encounter (Signed)
Called pt to advised rx has been sent to pharmacy no answer states call can not be completed

## 2021-10-18 NOTE — Telephone Encounter (Signed)
Spoke with pt advised rx sent to pharmacy pt verbalized understanding

## 2021-10-18 NOTE — Telephone Encounter (Signed)
Please advise pt scheduled 6/16

## 2021-11-07 ENCOUNTER — Other Ambulatory Visit: Payer: Self-pay

## 2021-11-07 ENCOUNTER — Telehealth: Payer: Self-pay | Admitting: Nurse Practitioner

## 2021-11-07 NOTE — Telephone Encounter (Signed)
Pt called to r/s appt due to her insu not going into effect until the end of the month. She only has a few of her bp pills left. Can she please get enough to last until her appt?   Lisinopril '5mg'$    [facility]

## 2021-11-08 NOTE — Telephone Encounter (Signed)
Called pt no answer call cannot be completed she should have a refill at [facility]

## 2021-11-11 ENCOUNTER — Ambulatory Visit: Payer: Self-pay | Admitting: Nurse Practitioner

## 2021-11-14 NOTE — Telephone Encounter (Signed)
Spoke with pt she did get refill from pharmacy

## 2021-12-09 ENCOUNTER — Encounter: Payer: Self-pay | Admitting: Nurse Practitioner

## 2021-12-09 ENCOUNTER — Ambulatory Visit (INDEPENDENT_AMBULATORY_CARE_PROVIDER_SITE_OTHER): Payer: Managed Care, Other (non HMO) | Admitting: Nurse Practitioner

## 2021-12-09 VITALS — BP 137/90 | HR 94 | Ht 63.0 in | Wt 247.0 lb

## 2021-12-09 DIAGNOSIS — R1084 Generalized abdominal pain: Secondary | ICD-10-CM | POA: Diagnosis not present

## 2021-12-09 DIAGNOSIS — E669 Obesity, unspecified: Secondary | ICD-10-CM | POA: Insufficient documentation

## 2021-12-09 DIAGNOSIS — I10 Essential (primary) hypertension: Secondary | ICD-10-CM

## 2021-12-09 DIAGNOSIS — J4520 Mild intermittent asthma, uncomplicated: Secondary | ICD-10-CM

## 2021-12-09 DIAGNOSIS — Z6841 Body Mass Index (BMI) 40.0 and over, adult: Secondary | ICD-10-CM

## 2021-12-09 NOTE — Assessment & Plan Note (Signed)
Cramps started 2 months ago We will check CMP today  Abdominal ultrasound done last year showed no gallstones but patient has fatty infiltration of the liver.  Patient encouraged to to lose weight

## 2021-12-09 NOTE — Assessment & Plan Note (Addendum)
Chronic condition well-controlled Rarely using her albuterol inhaler

## 2021-12-09 NOTE — Progress Notes (Signed)
   Christy Goodman     MRN: [id number]      DOB: Sep 04, 1995   HPI Christy Goodman with past medical history of hypertension, asthma, obesity is here for follow up and re-evaluation of chronic medical conditions, medication management   Has random abdominal cramps not associated with eating or menstruation since that past  2 months.  Abdominal cramps last for hours when it occurs.  patient denies nausea, vomiting, bloody stool weight loss, loss of appetite.     ROS Denies recent fever or chills. Denies sinus pressure, nasal congestion, ear pain or sore throat. Denies chest congestion, productive cough or wheezing. Denies chest pains, palpitations and leg swelling Denies dysuria, frequency, hesitancy or incontinence. Denies joint pain, swelling and limitation in mobility. Denies headaches, seizures, numbness, or tingling. Denies depression, anxiety or insomnia. Denies skin break down or rash.   PE  BP 137/90 (BP Location: Right Arm, Patient Position: Sitting, Cuff Size: Large)   Pulse 94   Ht '5\' 3"'$  (1.6 m)   Wt 247 lb (112 kg)   LMP 12/02/2021 (Approximate)   SpO2 95%   BMI 43.75 kg/m   Patient alert and oriented and in no cardiopulmonary distress.   Chest: Clear to auscultation bilaterally.  CVS: S1, S2 no murmurs, no S3.Regular rate.  ABD: Soft non tender.   Ext: No edema  MS: Adequate ROM spine, shoulders, hips and knees.  Skin: Intact, no ulcerations or rash noted.  Psych: Good eye contact, normal affect. Memory intact not anxious or depressed appearing.  CNS: CN 2-12 intact, power,  normal throughout.no focal deficits noted.   Assessment & Plan  Obesity Wt Readings from Last 3 Encounters:  12/09/21 247 lb (112 kg)  02/03/21 236 lb (107 kg)  01/07/21 238 lb (108 kg)  Patient stated that she does not engage in regular exercise, and agrees that she needs to improve her diet Need to increase intake of whole food consisting mainly vegetables and protein less  carbohydrate drinking at least 64 ounces of water daily, engaging in regular vigorous exercise at least 150 minutes weekly importance of portion control also discussed  Hypertension, essential BP Readings from Last 3 Encounters:  12/09/21 137/90  02/03/21 (!) 126/92  01/07/21 139/87  Chronic condition well-controlled on lisinopril 5 mg daily Continue current medication DASH diet advised Engage in regular vigorous exercises at least 150 minutes weekly Follow-up in 6 months  Asthma Chronic condition well-controlled Rarely using her albuterol inhaler  Generalized abdominal cramps Cramps started 2 months ago We will check CMP today  Abdominal ultrasound done last year showed no gallstones but patient has fatty infiltration of the liver.  Patient encouraged to to lose weight

## 2021-12-09 NOTE — Assessment & Plan Note (Signed)
BP Readings from Last 3 Encounters:  12/09/21 137/90  02/03/21 (!) 126/92  01/07/21 139/87  Chronic condition well-controlled on lisinopril 5 mg daily Continue current medication DASH diet advised Engage in regular vigorous exercises at least 150 minutes weekly Follow-up in 6 months

## 2021-12-09 NOTE — Assessment & Plan Note (Addendum)
Wt Readings from Last 3 Encounters:  12/09/21 247 lb (112 kg)  02/03/21 236 lb (107 kg)  01/07/21 238 lb (108 kg)  Patient stated that she does not engage in regular exercise, and agrees that she needs to improve her diet Need to increase intake of whole food consisting mainly vegetables and protein less carbohydrate drinking at least 64 ounces of water daily, engaging in regular vigorous exercise at least 150 minutes weekly importance of portion control also discussed

## 2021-12-09 NOTE — Patient Instructions (Signed)

## 2021-12-13 ENCOUNTER — Telehealth: Payer: Self-pay | Admitting: Nurse Practitioner

## 2021-12-13 LAB — CMP14+EGFR
ALT: 32 IU/L (ref 0–32)
AST: 22 IU/L (ref 0–40)
Albumin/Globulin Ratio: 1.5 (ref 1.2–2.2)
Albumin: 4.1 g/dL (ref 4.0–5.0)
Alkaline Phosphatase: 71 IU/L (ref 44–121)
BUN/Creatinine Ratio: 12 (ref 9–23)
BUN: 11 mg/dL (ref 6–20)
Bilirubin Total: <0.2 mg/dL (ref 0.0–1.2)
CO2: 22 mmol/L (ref 20–29)
Calcium: 9.1 mg/dL (ref 8.7–10.2)
Chloride: 103 mmol/L (ref 96–106)
Creatinine, Ser: 0.9 mg/dL (ref 0.57–1.00)
Globulin, Total: 2.7 g/dL (ref 1.5–4.5)
Glucose: 96 mg/dL (ref 70–99)
Potassium: 4.2 mmol/L (ref 3.5–5.2)
Sodium: 140 mmol/L (ref 134–144)
Total Protein: 6.8 g/dL (ref 6.0–8.5)
eGFR: 90 mL/min/{1.73_m2} (ref >59)

## 2021-12-13 NOTE — Telephone Encounter (Signed)
Pt returning call for labs 

## 2021-12-13 NOTE — Progress Notes (Signed)
Normal kidney function liver function continue current medications

## 2021-12-13 NOTE — Telephone Encounter (Signed)
Returned call went over labs.  

## 2022-01-18 ENCOUNTER — Ambulatory Visit: Payer: Managed Care, Other (non HMO)

## 2022-01-18 ENCOUNTER — Ambulatory Visit
Admission: EM | Admit: 2022-01-18 | Discharge: 2022-01-18 | Disposition: A | Discharge status: Home / Self Care | Payer: Managed Care, Other (non HMO) | Attending: Nurse Practitioner | Admitting: Nurse Practitioner

## 2022-01-18 DIAGNOSIS — M25532 Pain in left wrist: Secondary | ICD-10-CM | POA: Diagnosis not present

## 2022-01-18 MED ORDER — KETOROLAC TROMETHAMINE 30 MG/ML IJ SOLN
30.0000 mg | Freq: Once | INTRAMUSCULAR | Status: AC
  Administered 2022-01-18: 30 mg via INTRAMUSCULAR

## 2022-01-18 MED ORDER — IBUPROFEN 800 MG PO TABS: 800.0000 mg | ORAL_TABLET | Freq: Three times a day (TID) | ORAL | 0 refills | Status: DC

## 2022-01-18 NOTE — ED Triage Notes (Signed)
Pt reports pain and swelling in left wrist and left elbow pain since this morning. Pain is worse when left arm "certain ways".

## 2022-01-18 NOTE — ED Provider Notes (Signed)
[facility]    CSN: [id number] Arrival date & time: 01/18/22  1522      History   Chief Complaint Chief Complaint  Patient presents with   Wrist Pain    HPI Christy Goodman is a 26 y.o. female.   Patient presents with left wrist pain that began this morning when she woke up.  Denies any recent accident, fall, trauma, or injury to the wrist.  Reports initially when she woke up, she thought that she may be slept wrong.  However, has a day has progressed, the pain has worsened and at work she has been unable to perform certain functions.  Reports the pain is only present with certain movements in the wrist.  Denies any weakness, numbness or tingling in her fingers, redness, bruising, swelling, and fevers or nausea/vomiting since the wrist pain started.  She has not taken anything for the pain so far.  Of note, patient is right-handed.    Past Medical History:  Diagnosis Date   Allergies    Asthma    Gonorrhea 06/06/2016   Gonorrhea 06/06/2016   H/O seasonal allergies    Hypertension     Patient Active Problem List   Diagnosis Date Noted   Obesity 12/09/2021   Generalized abdominal cramps 12/09/2021   Abnormal uterine bleeding (AUB) 01/07/2021   Nausea & vomiting 08/31/2020   Dizziness 06/08/2020   Encounter to establish care 06/08/2020   Screening due 06/08/2020   Hypertension, essential 06/08/2020   Asthma 06/08/2020   Herpes genitalis in women 04/12/2015    History reviewed. No pertinent surgical history.  OB History     Gravida  0   Para  0   Term  0   Preterm  0   AB  0   Living  0      SAB  0   IAB  0   Ectopic  0   Multiple  0   Live Births  0            Home Medications    Prior to Admission medications   Medication Sig Start Date End Date Taking? Authorizing Provider  ibuprofen (ADVIL) 800 MG tablet Take 1 tablet (800 mg total) by mouth 3 (three) times daily. Take with food to prevent GI upset 01/18/22  Yes Noemi Chapel A, NP  albuterol (VENTOLIN HFA) 108 (90 Base) MCG/ACT inhaler Inhale 2 puffs into the lungs every 4 (four) hours as needed for wheezing or shortness of breath.  10/01/19   [provider]  lisinopril (ZESTRIL) 5 MG tablet TAKE ONE TABLET ('5MG'$  TOTAL) BY MOUTH DAILY 10/18/21   Paseda, Dewaine Conger, FNP  megestrol (MEGACE) 40 MG tablet 3 tablets a day for 5 days, 2 tablets a day for 5 days then 1 tablet daily Patient not taking: Reported on 12/09/2021 02/03/21   Florian Buff, MD  ondansetron (ZOFRAN-ODT) 4 MG disintegrating tablet Take 1 tablet (4 mg total) by mouth every 8 (eight) hours as needed for nausea or vomiting. Patient not taking: Reported on 02/03/2021 08/31/20   Noreene Larsson, NP    Family History Family History  Problem Relation Age of Onset   Cancer Paternal Grandmother    Diabetes Paternal Grandmother    Diabetes Paternal Aunt     Social History Social History   Tobacco Use   Smoking status: Former    Packs/day: 0.25    Years: 1.00    Total pack years: 0.25  Types: Cigarettes, Cigars   Smokeless tobacco: Never  Vaping Use   Vaping Use: Never used  Substance Use Topics   Alcohol use: Yes    Comment: occ; tequila   Drug use: Yes    Types: Marijuana    Comment: 3 joints per day     Allergies   Patient has no known allergies.   Review of Systems Review of Systems Per HPI  Physical Exam Triage Vital Signs ED Triage Vitals  Enc Vitals Group     BP 01/18/22 1601 (!) 135/99     Pulse Rate 01/18/22 1601 92     Resp 01/18/22 1601 16     Temp 01/18/22 1601 98.3 F (36.8 C)     Temp Source 01/18/22 1601 Oral     SpO2 01/18/22 1601 98 %     Weight --      Height --      Head Circumference --      Peak Flow --      Pain Score 01/18/22 1603 8     Pain Loc --      Pain Edu? --      Excl. in [place]? --    No data found.  Updated Vital Signs BP (!) 135/99 (BP Location: Right Arm)   Pulse 92   Temp 98.3 F (36.8 C) (Oral)   Resp 16   LMP  12/27/2021 (Exact Date)   SpO2 98%   Visual Acuity Right Eye Distance:   Left Eye Distance:   Bilateral Distance:    Right Eye Near:   Left Eye Near:    Bilateral Near:     Physical Exam Vitals and nursing note reviewed.  Constitutional:      General: She is not in acute distress.    Appearance: Normal appearance. She is not toxic-appearing.  HENT:     Mouth/Throat:     Mouth: Mucous membranes are moist.     Pharynx: Oropharynx is clear.  Pulmonary:     Effort: Pulmonary effort is normal. No respiratory distress.  Musculoskeletal:     Right wrist: Normal. No swelling, effusion, tenderness, bony tenderness or snuff box tenderness. Normal range of motion. Normal pulse.     Left wrist: Tenderness and bony tenderness present. No swelling, effusion or snuff box tenderness. Normal range of motion. Normal pulse.       Hands:     Comments: Inspection: No obvious swelling, deformity, or redness to the left or right wrists.   Palpation: Right wrist nontender to palpation.  Left wrist tender to palpation in the areas marked especially over the ulnar nerve.  No obvious deformities palpated bilaterally ROM: Full ROM to right wrist without pain.  Full range of motion to left wrist, however it is painful. Strength: 5/5 bilateral upper extremities Neurovascular: neurovascularly intact in left and right upper extremity   Skin:    General: Skin is warm and dry.     Capillary Refill: Capillary refill takes less than 2 seconds.     Coloration: Skin is not jaundiced or pale.     Findings: No erythema or rash.  Neurological:     Mental Status: She is alert and oriented to person, place, and time.  Psychiatric:        Behavior: Behavior is cooperative.      UC Treatments / Results  Labs (all labs ordered are listed, but only abnormal results are displayed) Labs Reviewed - No data to display  EKG   Radiology DG  Wrist Complete Left  Result Date: 01/18/2022 CLINICAL DATA:  wrist pain  x 1 day; no known injury EXAM: LEFT WRIST - COMPLETE 4 VIEW COMPARISON:  None Available. FINDINGS: There is no evidence of fracture or dislocation. There is no evidence of arthropathy or other focal bone abnormality. Soft tissues are unremarkable. IMPRESSION: Negative. Electronically Signed   By: Frazier Richards M.D.   On: 01/18/2022 16:26    Procedures Procedures (including critical care time)  Medications Ordered in UC Medications  ketorolac (TORADOL) 30 MG/ML injection 30 mg (has no administration in time range)    Initial Impression / Assessment and Plan / UC Course  I have reviewed the triage vital signs and the nursing notes.  Pertinent labs & imaging results that were available during my care of the patient were reviewed by me and considered in my medical decision making (see chart for details).    Patient is a very pleasant, well-appearing 26 year old female presenting for left wrist pain today.  In triage, she is slightly hypertensive, otherwise vital signs are stable.  She is not tachycardic, not tachypneic, no fever, and is oxygenating well on room air.  She appears well overall.  X-ray of the wrist today is negative for acute bony abnormality.  Suspect ulnar nerve involvement.  Treat with NSAIDs, wrist brace, rest, ice, and elevation.  Encouraged follow-up with orthopedic provider with no improvement with this regimen.  Contact information given.  Note given for work.  The patient was given the opportunity to ask questions.  All questions answered to their satisfaction.  The patient is in agreement to this plan.  Final Clinical Impressions(s) / UC Diagnoses   Final diagnoses:  Left wrist pain     Discharge Instructions      - Left wrist x-ray is negative for acute fractures today -I suspect the pain you are having is in the nerve in your wrist -We have given you a shot of Toradol today for pain/inflammation -Tomorrow, you can start the ibuprofen 800 mg every 8 hours as needed  for pain.  Please wear the brace if it helps and you can also use ice on your wrist to see if this helps. -If symptoms or not improved by Friday, please contact the orthopedic provider.  I provided the contact information.     ED Prescriptions     Medication Sig Dispense Auth. Provider   ibuprofen (ADVIL) 800 MG tablet Take 1 tablet (800 mg total) by mouth 3 (three) times daily. Take with food to prevent GI upset 21 tablet Eulogio Bear, NP      PDMP not reviewed this encounter.   Eulogio Bear, NP 01/18/22 [phone]

## 2022-01-18 NOTE — Discharge Instructions (Signed)
-   Left wrist x-ray is negative for acute fractures today -I suspect the pain you are having is in the nerve in your wrist -We have given you a shot of Toradol today for pain/inflammation -Tomorrow, you can start the ibuprofen 800 mg every 8 hours as needed for pain.  Please wear the brace if it helps and you can also use ice on your wrist to see if this helps. -If symptoms or not improved by Friday, please contact the orthopedic provider.  I provided the contact information.

## 2022-03-07 ENCOUNTER — Encounter: Payer: Self-pay | Admitting: Nurse Practitioner

## 2022-06-14 ENCOUNTER — Ambulatory Visit (INDEPENDENT_AMBULATORY_CARE_PROVIDER_SITE_OTHER): Payer: Managed Care, Other (non HMO) | Admitting: Family Medicine

## 2022-06-14 ENCOUNTER — Encounter: Payer: Self-pay | Admitting: Family Medicine

## 2022-06-14 VITALS — BP 130/81 | HR 99 | Ht 63.0 in | Wt 243.1 lb

## 2022-06-14 DIAGNOSIS — Z0001 Encounter for general adult medical examination with abnormal findings: Secondary | ICD-10-CM | POA: Diagnosis not present

## 2022-06-14 DIAGNOSIS — Z30011 Encounter for initial prescription of contraceptive pills: Secondary | ICD-10-CM | POA: Insufficient documentation

## 2022-06-14 DIAGNOSIS — E559 Vitamin D deficiency, unspecified: Secondary | ICD-10-CM

## 2022-06-14 DIAGNOSIS — J4520 Mild intermittent asthma, uncomplicated: Secondary | ICD-10-CM | POA: Diagnosis not present

## 2022-06-14 DIAGNOSIS — E7849 Other hyperlipidemia: Secondary | ICD-10-CM

## 2022-06-14 DIAGNOSIS — R7301 Impaired fasting glucose: Secondary | ICD-10-CM

## 2022-06-14 DIAGNOSIS — E038 Other specified hypothyroidism: Secondary | ICD-10-CM

## 2022-06-14 DIAGNOSIS — I10 Essential (primary) hypertension: Secondary | ICD-10-CM | POA: Diagnosis not present

## 2022-06-14 MED ORDER — NORETHINDRONE 0.35 MG PO TABS: 1.0000 | ORAL_TABLET | Freq: Every day | ORAL | 1 refills | Status: DC

## 2022-06-14 NOTE — Assessment & Plan Note (Signed)
She would like to start oral contraception Negative pregnancy test in the clinic Denies history of breast cancer ischemic heart disease or stroke Will start patient on progesterone only pills Education has follow Progesterone only oral contraception Warning signs  Severe lower abdominal pain No bleeding after a series of regular cycles Severe headaches  Instructions for use Quick start method-reasonably certain not pregnant, take first pill on day of office visit; backup method for 48 hours if more than 5 days since last menstrual period First day start-take first pill on first day of menses; no backup method needed Take pill at approximately same time each day, every day; no placebo/of days If more than 3 hours late taking pill, use backup method for 48 hours May have irregular periods and may have amenorrhea Strict daily dosing schedule; serum progesterone level peak shortly after taking POP, then declined to nearly undetectable levels 24 hours later If nausea occurs, take pill with meals or at bedtime Use backup method (condoms) if efficacy/absorbency compromised by severe vomiting and or diarrhea Use condoms for prevention of STIs and HIV  Common side effects/adverse effects Menstrual cycle irregularities, mastalgia, and depression

## 2022-06-14 NOTE — Patient Instructions (Signed)
I appreciate the opportunity to provide care to you today!    Follow up:  3 months  Labs: please stop by the lab during the week to get your blood drawn (CBC, CMP, TSH, Lipid profile, HgA1c, Vit D)  Please pick up your medication at the pharmacy and take medication as prescribed  You were started on an oral progesterone only birth control today  Warning signs  Severe lower abdominal pain No bleeding after a series of regular cycles Severe headaches  Instructions for use Quick start method-reasonably certain not pregnant, take first pill on day of office visit; backup method for 48 hours if more than 5 days since last menstrual period First day start-take first pill on first day of menses; no backup method needed Take pill at approximately same time each day, every day; no placebo/of days If more than 3 hours late taking pill, use backup method for 48 hours May have irregular periods and may have amenorrhea Strict daily dosing schedule; serum progesterone level peak shortly after taking POP, then declined to nearly undetectable levels 24 hours later If nausea occurs, take pill with meals or at bedtime Use backup method (condoms) if efficacy/absorbency compromised by severe vomiting and or diarrhea Use condoms for prevention of STIs and HIV  Common side effects/adverse effects Menstrual cycle irregularities, mastalgia, and depression   Please continue to a heart-healthy diet and increase your physical activities. Try to exercise for 66mns at least five times a week.      It was a pleasure to see you and I look forward to continuing to work together on your health and well-being. Please do not hesitate to call the office if you need care or have questions about your care.   Have a wonderful day and week. With Gratitude, GAlvira MondayMSN, FNP-BC

## 2022-06-14 NOTE — Progress Notes (Signed)
Complete physical exam  Patient: Christy Goodman   DOB: 01-Apr-1996   27 y.o. Female  MRN: [id number]  Subjective:    Chief Complaint  Patient presents with   Annual Exam    Cpe today, will need a work note. Has concerns about anxiety and need to discuss contraception.     Christy Goodman is a 27 y.o. female who presents today for a complete physical exam. She reports consuming a general diet. The patient does not participate in regular exercise at present. She generally feels well. She reports sleeping well. She does have additional problems to discuss today.    Most recent fall risk assessment:    06/14/2022    9:00 AM  Fall Risk   Falls in the past year? 0  Number falls in past yr: 0  Injury with Fall? 0  Risk for fall due to : No Fall Risks  Follow up Falls evaluation completed     Most recent depression screenings:    06/14/2022    9:00 AM 12/09/2021   11:42 AM  PHQ 2/9 Scores  PHQ - 2 Score 0 0  PHQ- 9 Score 1     Dental: No current dental problems and No regular dental care   Patient Active Problem List   Diagnosis Date Noted   Oral contraception initiation 06/14/2022   Obesity 12/09/2021   Generalized abdominal cramps 12/09/2021   Abnormal uterine bleeding (AUB) 01/07/2021   Nausea & vomiting 08/31/2020   Dizziness 06/08/2020   Encounter for general adult medical examination with abnormal findings 06/08/2020   Screening due 06/08/2020   Hypertension, essential 06/08/2020   Asthma 06/08/2020   Herpes genitalis in women 04/12/2015   Past Medical History:  Diagnosis Date   Allergies    Asthma    Gonorrhea 06/06/2016   Gonorrhea 06/06/2016   H/O seasonal allergies    Hypertension    History reviewed. No pertinent surgical history. Social History   Tobacco Use   Smoking status: Former    Packs/day: 0.25    Years: 1.00    Total pack years: 0.25    Types: Cigarettes, Cigars   Smokeless tobacco: Never  Vaping Use   Vaping Use: Never used   Substance Use Topics   Alcohol use: Yes    Comment: occ; tequila   Drug use: Yes    Types: Marijuana    Comment: 3 joints per day   Social History   Socioeconomic History   Marital status: Single    Spouse name: Not on file   Number of children: Not on file   Years of education: Not on file   Highest education level: Not on file  Occupational History   Not on file  Tobacco Use   Smoking status: Former    Packs/day: 0.25    Years: 1.00    Total pack years: 0.25    Types: Cigarettes, Cigars   Smokeless tobacco: Never  Vaping Use   Vaping Use: Never used  Substance and Sexual Activity   Alcohol use: Yes    Comment: occ; tequila   Drug use: Yes    Types: Marijuana    Comment: 3 joints per day   Sexual activity: Yes    Birth control/protection: None    Comment: sexual partner is female- no chance of pregnancy  Other Topics Concern   Not on file  Social History Narrative   Not on file   Social Determinants of Health   Financial Resource Strain:  Low Risk  (02/03/2021)   Overall Financial Resource Strain (CARDIA)    Difficulty of Paying Living Expenses: Not hard at all  Food Insecurity: No Food Insecurity (02/03/2021)   Hunger Vital Sign    Worried About Running Out of Food in the Last Year: Never true    Ran Out of Food in the Last Year: Never true  Transportation Needs: No Transportation Needs (02/03/2021)   PRAPARE - Hydrologist (Medical): No    Lack of Transportation (Non-Medical): No  Physical Activity: Sufficiently Active (02/03/2021)   Exercise Vital Sign    Days of Exercise per Week: 5 days    Minutes of Exercise per Session: 30 min  Stress: No Stress Concern Present (02/03/2021)   [place]    Feeling of Stress : Not at all  Social Connections: Socially Isolated (02/03/2021)   Social Connection and Isolation Panel [NHANES]    Frequency of Communication with Friends and  Family: More than three times a week    Frequency of Social Gatherings with Friends and Family: Twice a week    Attends Religious Services: Never    Marine scientist or Organizations: No    Attends Archivist Meetings: Never    Marital Status: Never married  Intimate Partner Violence: Not At Risk (02/03/2021)   Humiliation, Afraid, Rape, and Kick questionnaire    Fear of Current or Ex-Partner: No    Emotionally Abused: No    Physically Abused: No    Sexually Abused: No   Family Status  Relation Name Status   Mother  Deceased   Father  Alive   Sister  Alive   Brother  Alive   MGM  Alive   MGF  Deceased   [place]chance  Deceased   PGF  Alive   Sister  Alive   Field seismologist  (Not Specified)   Family History  Problem Relation Age of Onset   Cancer Paternal Grandmother    Diabetes Paternal Grandmother    Diabetes Paternal Aunt    No Known Allergies    Patient Care Team: Alvira Monday, FNP as PCP - General (Family Medicine)   Outpatient Medications Prior to Visit  Medication Sig   albuterol (VENTOLIN HFA) 108 (90 Base) MCG/ACT inhaler Inhale 2 puffs into the lungs every 4 (four) hours as needed for wheezing or shortness of breath.    lisinopril (ZESTRIL) 5 MG tablet TAKE ONE TABLET ('5MG'$  TOTAL) BY MOUTH DAILY   [DISCONTINUED] ibuprofen (ADVIL) 800 MG tablet Take 1 tablet (800 mg total) by mouth 3 (three) times daily. Take with food to prevent GI upset   [DISCONTINUED] megestrol (MEGACE) 40 MG tablet 3 tablets a day for 5 days, 2 tablets a day for 5 days then 1 tablet daily (Patient not taking: Reported on 12/09/2021)   [DISCONTINUED] ondansetron (ZOFRAN-ODT) 4 MG disintegrating tablet Take 1 tablet (4 mg total) by mouth every 8 (eight) hours as needed for nausea or vomiting. (Patient not taking: Reported on 02/03/2021)   No facility-administered medications prior to visit.    Review of Systems  Constitutional:  Negative for chills, fever and malaise/fatigue.  HENT:  Negative  for congestion and sinus pain.   Eyes:  Negative for pain, discharge and redness.  Respiratory:  Negative for cough, sputum production and shortness of breath.   Cardiovascular:  Negative for chest pain, palpitations, claudication and leg swelling.  Gastrointestinal:  Negative for diarrhea, heartburn  and nausea.  Genitourinary:  Negative for flank pain and frequency.  Musculoskeletal:  Negative for back pain and joint pain.  Skin:  Negative for itching.  Neurological:  Negative for dizziness, seizures and headaches.  Endo/Heme/Allergies:  Negative for environmental allergies.  Psychiatric/Behavioral:  Negative for memory loss. The patient does not have insomnia.        Objective:    BP 130/81   Pulse 99   Ht '5\' 3"'$  (1.6 m)   Wt 243 lb 1.9 oz (110.3 kg)   SpO2 98%   BMI 43.07 kg/m  BP Readings from Last 3 Encounters:  06/14/22 130/81  01/18/22 (!) 135/99  12/09/21 137/90   Wt Readings from Last 3 Encounters:  06/14/22 243 lb 1.9 oz (110.3 kg)  12/09/21 247 lb (112 kg)  02/03/21 236 lb (107 kg)      Physical Exam HENT:     Head: Normocephalic.     Left Ear: External ear normal.     Mouth/Throat:     Mouth: Mucous membranes are moist.  Eyes:     Extraocular Movements: Extraocular movements intact.     Pupils: Pupils are equal, round, and reactive to light.  Cardiovascular:     Rate and Rhythm: Normal rate and regular rhythm.     Heart sounds: No murmur heard. Pulmonary:     Effort: Pulmonary effort is normal.     Breath sounds: Normal breath sounds.  Abdominal:     Palpations: Abdomen is soft.     Tenderness: There is no right CVA tenderness or left CVA tenderness.  Musculoskeletal:     Right lower leg: No edema.     Left lower leg: No edema.  Skin:    Findings: No lesion or rash.  Neurological:     Mental Status: She is alert and oriented to person, place, and time.     GCS: GCS eye subscore is 4. GCS verbal subscore is 5. GCS motor subscore is 6.     Cranial  Nerves: No facial asymmetry.     Sensory: No sensory deficit.     Motor: No weakness.     Coordination: Coordination normal. Finger-Nose-Finger Test normal.     Gait: Gait normal.  Psychiatric:        Judgment: Judgment normal.     No results found for any visits on 06/14/22. Last CBC Lab Results  Component Value Date   WBC 6.4 06/21/2020   HGB 12.6 06/21/2020   HCT 36.4 06/21/2020   MCV 84 06/21/2020   MCH 29.0 06/21/2020   RDW 13.9 06/21/2020   PLT 326 60/63/0160   Last metabolic panel Lab Results  Component Value Date   GLUCOSE 96 12/12/2021   NA 140 12/12/2021   K 4.2 12/12/2021   CL 103 12/12/2021   CO2 22 12/12/2021   BUN 11 12/12/2021   CREATININE 0.90 12/12/2021   EGFR 90 12/12/2021   CALCIUM 9.1 12/12/2021   PROT 6.8 12/12/2021   ALBUMIN 4.1 12/12/2021   LABGLOB 2.7 12/12/2021   AGRATIO 1.5 12/12/2021   BILITOT <0.2 12/12/2021   ALKPHOS 71 12/12/2021   AST 22 12/12/2021   ALT 32 12/12/2021   ANIONGAP 7 06/05/2020   Last lipids Lab Results  Component Value Date   CHOL 112 06/21/2020   HDL 36 (L) 06/21/2020   LDLCALC 53 06/21/2020   TRIG 130 06/21/2020   Last hemoglobin A1c No results found for: "HGBA1C" Last thyroid functions Lab Results  Component Value Date  TSH 1.550 06/21/2020   Last vitamin D No results found for: "25OHVITD2", "25OHVITD3", "VD25OH" Last vitamin B12 and Folate No results found for: "VITAMINB12", "FOLATE"      Assessment & Plan:    Routine Health Maintenance and Physical Exam  Immunization History  Administered Date(s) Administered   PFIZER(Purple Top)SARS-COV-2 Vaccination 12/22/2020   Tdap 02/13/2016    Health Maintenance  Topic Date Due   COVID-19 Vaccine (2 - 2023-24 season) 01/27/2022   INFLUENZA VACCINE  08/27/2022 (Originally 12/27/2021)   PAP-Cervical Cytology Screening  07/01/2023   PAP SMEAR-Modifier  07/01/2023   DTaP/Tdap/Td (2 - Td or Tdap) 02/12/2026   Hepatitis C Screening  Completed   HIV  Screening  Completed   HPV VACCINES  Aged Out    Discussed health benefits of physical activity, and encouraged her to engage in regular exercise appropriate for her age and condition.  Encounter for general adult medical examination with abnormal findings Assessment & Plan: Physical exam as documented Counseling is done on healthy lifestyle involving commitment to 150 minutes of exercise per week,  Discussed heart-healthy diet and attaining a healthy weight Patient is up-to-date with her immunization Educated on importance of physical activities Physical activity helps: Lower your blood glucose, improve your heart health, lower your blood pressure and cholesterol, burn calories to help manage her weight, gave you energy, lower stress, and improve his sleep.  The American diabetes Association (ADA) recommends being active for 2-1/2 hours (150 minutes) or more week.  Exercise for 30 minutes, 5 days a week (150 minutes total)       Oral contraception initiation Assessment & Plan: She would like to start oral contraception Negative pregnancy test in the clinic Denies history of breast cancer ischemic heart disease or stroke Will start patient on progesterone only pills Education has follow Progesterone only oral contraception Warning signs  Severe lower abdominal pain No bleeding after a series of regular cycles Severe headaches  Instructions for use Quick start method-reasonably certain not pregnant, take first pill on day of office visit; backup method for 48 hours if more than 5 days since last menstrual period First day start-take first pill on first day of menses; no backup method needed Take pill at approximately same time each day, every day; no placebo/of days If more than 3 hours late taking pill, use backup method for 48 hours May have irregular periods and may have amenorrhea Strict daily dosing schedule; serum progesterone level peak shortly after taking POP, then  declined to nearly undetectable levels 24 hours later If nausea occurs, take pill with meals or at bedtime Use backup method (condoms) if efficacy/absorbency compromised by severe vomiting and or diarrhea Use condoms for prevention of STIs and HIV  Common side effects/adverse effects Menstrual cycle irregularities, mastalgia, and depression   Orders: -     Norethindrone; Take 1 tablet (0.35 mg total) by mouth daily.  Dispense: 84 tablet; Refill: 1  Hypertension, essential -     CMP14+EGFR -     CBC with Differential/Platelet  Mild intermittent asthma without complication  Other hyperlipidemia -     Lipid panel  Other specified hypothyroidism -     TSH + free T4  Vitamin D deficiency -     VITAMIN D 25 Hydroxy (Vit-D Deficiency, Fractures)  IFG (impaired fasting glucose) -     Hemoglobin A1c -     VITAMIN D 25 Hydroxy (Vit-D Deficiency, Fractures)    Return in about 1 year (around 06/15/2023) for CPE.  Alvira Monday, FNP

## 2022-06-14 NOTE — Assessment & Plan Note (Signed)
Physical exam as documented Counseling is done on healthy lifestyle involving commitment to 150 minutes of exercise per week,  Discussed heart-healthy diet and attaining a healthy weight Patient is up-to-date with her immunization Educated on importance of physical activities Physical activity helps: Lower your blood glucose, improve your heart health, lower your blood pressure and cholesterol, burn calories to help manage her weight, gave you energy, lower stress, and improve his sleep.  The American diabetes Association (ADA) recommends being active for 2-1/2 hours (150 minutes) or more week.  Exercise for 30 minutes, 5 days a week (150 minutes total)

## 2022-06-15 ENCOUNTER — Encounter: Payer: Self-pay | Admitting: Internal Medicine

## 2022-06-15 ENCOUNTER — Ambulatory Visit (INDEPENDENT_AMBULATORY_CARE_PROVIDER_SITE_OTHER): Payer: Managed Care, Other (non HMO) | Admitting: Internal Medicine

## 2022-06-15 VITALS — BP 138/86 | HR 111 | Ht 63.0 in | Wt 242.6 lb

## 2022-06-15 DIAGNOSIS — Z1152 Encounter for screening for COVID-19: Secondary | ICD-10-CM | POA: Diagnosis not present

## 2022-06-15 DIAGNOSIS — J069 Acute upper respiratory infection, unspecified: Secondary | ICD-10-CM

## 2022-06-15 DIAGNOSIS — R6889 Other general symptoms and signs: Secondary | ICD-10-CM

## 2022-06-15 LAB — POCT URINE PREGNANCY: Preg Test, Ur: NEGATIVE

## 2022-06-15 LAB — POCT INFLUENZA A/B
Influenza A, POC: NEGATIVE
Influenza B, POC: NEGATIVE

## 2022-06-15 MED ORDER — NOREL AD 4-10-325 MG PO TABS: 1.0000 | ORAL_TABLET | Freq: Three times a day (TID) | ORAL | 0 refills | Status: DC | PRN

## 2022-06-15 NOTE — Addendum Note (Signed)
Addended by: Bolivar Haw on: 06/15/2022 08:19 AM   Modules accepted: Orders

## 2022-06-15 NOTE — Patient Instructions (Addendum)
Please take Norel as needed for nasal congestion.  Please use nasal saline spray as needed for nasal congestion.

## 2022-06-15 NOTE — Progress Notes (Signed)
Acute Office Visit  Subjective:    Patient ID: Christy Goodman, female    DOB: November 27, 1995, 27 y.o.   MRN: [id number]  Chief Complaint  Patient presents with   Influenza    Patient is having body aches, chills, cough, no appetite, scratchy throat.    HPI Patient is in today for complaint of fever, chills, body aches, decreased appetite and sore throat since last night.  She was seen by her PCP yesterday, but did not have these symptoms.  She reports that her uncle and aunt had flu about 2 weeks ago.  She denies any dyspnea or wheezing currently.  She has tried taking OTC cold and flu medicine.  Past Medical History:  Diagnosis Date   Allergies    Asthma    Gonorrhea 06/06/2016   Gonorrhea 06/06/2016   H/O seasonal allergies    Hypertension     History reviewed. No pertinent surgical history.  Family History  Problem Relation Age of Onset   Cancer Paternal Grandmother    Diabetes Paternal Grandmother    Diabetes Paternal Aunt     Social History   Socioeconomic History   Marital status: Single    Spouse name: Not on file   Number of children: Not on file   Years of education: Not on file   Highest education level: Not on file  Occupational History   Not on file  Tobacco Use   Smoking status: Former    Packs/day: 0.25    Years: 1.00    Total pack years: 0.25    Types: Cigarettes, Cigars   Smokeless tobacco: Never  Vaping Use   Vaping Use: Never used  Substance and Sexual Activity   Alcohol use: Yes    Comment: occ; tequila   Drug use: Yes    Types: Marijuana    Comment: 3 joints per day   Sexual activity: Yes    Birth control/protection: None    Comment: sexual partner is female- no chance of pregnancy  Other Topics Concern   Not on file  Social History Narrative   Not on file   Social Determinants of Health   Financial Resource Strain: Low Risk  (02/03/2021)   Overall Financial Resource Strain (CARDIA)    Difficulty of Paying Living Expenses: Not hard  at all  Food Insecurity: No Food Insecurity (02/03/2021)   Hunger Vital Sign    Worried About Running Out of Food in the Last Year: Never true    [place] in the Last Year: Never true  Transportation Needs: No Transportation Needs (02/03/2021)   PRAPARE - Hydrologist (Medical): No    Lack of Transportation (Non-Medical): No  Physical Activity: Sufficiently Active (02/03/2021)   Exercise Vital Sign    Days of Exercise per Week: 5 days    Minutes of Exercise per Session: 30 min  Stress: No Stress Concern Present (02/03/2021)   [place]    Feeling of Stress : Not at all  Social Connections: Socially Isolated (02/03/2021)   Social Connection and Isolation Panel [NHANES]    Frequency of Communication with Friends and Family: More than three times a week    Frequency of Social Gatherings with Friends and Family: Twice a week    Attends Religious Services: Never    Marine scientist or Organizations: No    Attends Archivist Meetings: Never    Marital Status: Never  married  Intimate Partner Violence: Not At Risk (02/03/2021)   Humiliation, Afraid, Rape, and Kick questionnaire    Fear of Current or Ex-Partner: No    Emotionally Abused: No    Physically Abused: No    Sexually Abused: No    Outpatient Medications Prior to Visit  Medication Sig Dispense Refill   albuterol (VENTOLIN HFA) 108 (90 Base) MCG/ACT inhaler Inhale 2 puffs into the lungs every 4 (four) hours as needed for wheezing or shortness of breath.      lisinopril (ZESTRIL) 5 MG tablet TAKE ONE TABLET ('5MG'$  TOTAL) BY MOUTH DAILY 90 tablet 1   norethindrone (ERRIN) 0.35 MG tablet Take 1 tablet (0.35 mg total) by mouth daily. 84 tablet 1   No facility-administered medications prior to visit.    No Known Allergies  Review of Systems  Constitutional:  Positive for chills, fatigue and fever.  HENT:  Positive for  congestion, postnasal drip, rhinorrhea, sinus pressure and sore throat.   Respiratory:  Positive for cough. Negative for shortness of breath.   Cardiovascular:  Negative for chest pain and palpitations.  Gastrointestinal:  Negative for diarrhea and nausea.  Genitourinary:  Negative for dysuria and hematuria.  Musculoskeletal:  Positive for myalgias. Negative for neck pain and neck stiffness.  Skin:  Negative for rash.  Psychiatric/Behavioral:  Negative for agitation and behavioral problems.        Objective:    Physical Exam Constitutional:      General: She is not in acute distress.    Appearance: She is obese. She is not diaphoretic.  HENT:     Nose: Congestion present.     Right Sinus: Frontal sinus tenderness present. No maxillary sinus tenderness.     Left Sinus: Frontal sinus tenderness present. No maxillary sinus tenderness.     Mouth/Throat:     Mouth: Mucous membranes are moist.     Pharynx: Posterior oropharyngeal erythema present.  Cardiovascular:     Rate and Rhythm: Normal rate and regular rhythm.     Heart sounds: Normal heart sounds. No murmur heard. Pulmonary:     Breath sounds: Normal breath sounds. No wheezing or rales.  Skin:    General: Skin is warm.     Findings: No rash.  Neurological:     General: No focal deficit present.     Mental Status: She is alert and oriented to person, place, and time.  Psychiatric:        Mood and Affect: Mood normal.        Behavior: Behavior normal.     BP 138/86 (BP Location: Right Arm, Patient Position: Sitting, Cuff Size: Large)   Pulse (!) 111   Ht '5\' 3"'$  (1.6 m)   Wt 242 lb 9.6 oz (110 kg)   SpO2 98%   BMI 42.97 kg/m  Wt Readings from Last 3 Encounters:  06/15/22 242 lb 9.6 oz (110 kg)  06/14/22 243 lb 1.9 oz (110.3 kg)  12/09/21 247 lb (112 kg)        Assessment & Plan:   Problem List Items Addressed This Visit    Visit Diagnoses     URTI (acute upper respiratory infection)    -  Primary Rapid flu  negative Check COVID RT-PCR Norel AD as needed for nasal congestion Nasal saline spray as needed for nasal congestion Warm water gargling for sore throat If persistent symptoms with negative viral testing, will start antibiotic   Relevant Medications   Chlorphen-PE-Acetaminophen (NOREL AD) 4-10-325 MG TABS  Flu-like symptoms       Relevant Orders   POCT Influenza A/B (Completed)   Encounter for screening for COVID-19       Relevant Orders   Novel Coronavirus, NAA ([facility])        Meds ordered this encounter  Medications   Chlorphen-PE-Acetaminophen (NOREL AD) 4-10-325 MG TABS    Sig: Take 1 tablet by mouth 3 (three) times daily as needed (Nasal congestion).    Dispense:  30 tablet    Refill:  0     Mija Effertz Keith Rake, MD

## 2022-06-17 LAB — TSH+FREE T4
Free T4: 1.24 ng/dL (ref 0.82–1.77)
TSH: 1.06 u[IU]/mL (ref 0.450–4.500)

## 2022-06-17 LAB — CBC WITH DIFFERENTIAL/PLATELET
Basophils Absolute: 0.1 10*3/uL (ref 0.0–0.2)
Basos: 0 %
EOS (ABSOLUTE): 0 10*3/uL (ref 0.0–0.4)
Eos: 0 %
Hematocrit: 35.6 % (ref 34.0–46.6)
Hemoglobin: 12.2 g/dL (ref 11.1–15.9)
Immature Grans (Abs): 0 10*3/uL (ref 0.0–0.1)
Immature Granulocytes: 0 %
Lymphocytes Absolute: 1.8 10*3/uL (ref 0.7–3.1)
Lymphs: 12 %
MCH: 29.6 pg (ref 26.6–33.0)
MCHC: 34.3 g/dL (ref 31.5–35.7)
MCV: 86 fL (ref 79–97)
Monocytes Absolute: 0.9 10*3/uL (ref 0.1–0.9)
Monocytes: 6 %
Neutrophils Absolute: 12 10*3/uL — ABNORMAL HIGH (ref 1.4–7.0)
Neutrophils: 82 %
Platelets: 318 10*3/uL (ref 150–450)
RBC: 4.12 x10E6/uL (ref 3.77–5.28)
RDW: 12.9 % (ref 11.7–15.4)
WBC: 14.8 10*3/uL — ABNORMAL HIGH (ref 3.4–10.8)

## 2022-06-17 LAB — CMP14+EGFR
ALT: 22 IU/L (ref 0–32)
AST: 17 IU/L (ref 0–40)
Albumin/Globulin Ratio: 1.5 (ref 1.2–2.2)
Albumin: 4.2 g/dL (ref 4.0–5.0)
Alkaline Phosphatase: 78 IU/L (ref 44–121)
BUN/Creatinine Ratio: 9 (ref 9–23)
BUN: 8 mg/dL (ref 6–20)
Bilirubin Total: 0.4 mg/dL (ref 0.0–1.2)
CO2: 22 mmol/L (ref 20–29)
Calcium: 9.2 mg/dL (ref 8.7–10.2)
Chloride: 101 mmol/L (ref 96–106)
Creatinine, Ser: 0.93 mg/dL (ref 0.57–1.00)
Globulin, Total: 2.8 g/dL (ref 1.5–4.5)
Glucose: 92 mg/dL (ref 70–99)
Potassium: 4.1 mmol/L (ref 3.5–5.2)
Sodium: 139 mmol/L (ref 134–144)
Total Protein: 7 g/dL (ref 6.0–8.5)
eGFR: 86 mL/min/{1.73_m2} (ref >59)

## 2022-06-17 LAB — LIPID PANEL
Chol/HDL Ratio: 2.2 ratio (ref 0.0–4.4)
Cholesterol, Total: 103 mg/dL (ref 100–199)
HDL: 46 mg/dL (ref >39)
LDL Chol Calc (NIH): 40 mg/dL (ref 0–99)
Triglycerides: 82 mg/dL (ref 0–149)
VLDL Cholesterol Cal: 17 mg/dL (ref 5–40)

## 2022-06-17 LAB — HEMOGLOBIN A1C
Est. average glucose Bld gHb Est-mCnc: 123 mg/dL
Hgb A1c MFr Bld: 5.9 % — ABNORMAL HIGH (ref 4.8–5.6)

## 2022-06-17 LAB — VITAMIN D 25 HYDROXY (VIT D DEFICIENCY, FRACTURES): Vit D, 25-Hydroxy: 31.2 ng/mL (ref 30.0–100.0)

## 2022-06-17 LAB — NOVEL CORONAVIRUS, NAA: SARS-CoV-2, NAA: NOT DETECTED

## 2022-06-20 ENCOUNTER — Other Ambulatory Visit: Payer: Self-pay | Admitting: Internal Medicine

## 2022-06-20 ENCOUNTER — Telehealth: Payer: Self-pay | Admitting: Internal Medicine

## 2022-06-20 ENCOUNTER — Other Ambulatory Visit: Payer: Self-pay

## 2022-06-20 ENCOUNTER — Encounter (HOSPITAL_COMMUNITY): Payer: Self-pay

## 2022-06-20 ENCOUNTER — Emergency Department (HOSPITAL_COMMUNITY)
Admission: EM | Admit: 2022-06-20 | Discharge: 2022-06-20 | Disposition: A | Discharge status: Home / Self Care | Payer: Managed Care, Other (non HMO) | Attending: Emergency Medicine | Admitting: Emergency Medicine

## 2022-06-20 DIAGNOSIS — J069 Acute upper respiratory infection, unspecified: Secondary | ICD-10-CM

## 2022-06-20 DIAGNOSIS — J45909 Unspecified asthma, uncomplicated: Secondary | ICD-10-CM | POA: Insufficient documentation

## 2022-06-20 DIAGNOSIS — I10 Essential (primary) hypertension: Secondary | ICD-10-CM | POA: Insufficient documentation

## 2022-06-20 DIAGNOSIS — Z1152 Encounter for screening for COVID-19: Secondary | ICD-10-CM | POA: Diagnosis not present

## 2022-06-20 DIAGNOSIS — J029 Acute pharyngitis, unspecified: Secondary | ICD-10-CM | POA: Insufficient documentation

## 2022-06-20 DIAGNOSIS — H9201 Otalgia, right ear: Secondary | ICD-10-CM | POA: Insufficient documentation

## 2022-06-20 DIAGNOSIS — Z79899 Other long term (current) drug therapy: Secondary | ICD-10-CM | POA: Insufficient documentation

## 2022-06-20 DIAGNOSIS — K146 Glossodynia: Secondary | ICD-10-CM

## 2022-06-20 DIAGNOSIS — R051 Acute cough: Secondary | ICD-10-CM | POA: Insufficient documentation

## 2022-06-20 LAB — RESP PANEL BY RT-PCR (RSV, FLU A&B, COVID)  RVPGX2
Influenza A by PCR: NEGATIVE
Influenza B by PCR: NEGATIVE
Resp Syncytial Virus by PCR: NEGATIVE
SARS Coronavirus 2 by RT PCR: NEGATIVE

## 2022-06-20 LAB — GROUP A STREP BY PCR: Group A Strep by PCR: NOT DETECTED

## 2022-06-20 MED ORDER — DEXAMETHASONE 4 MG PO TABS
10.0000 mg | ORAL_TABLET | Freq: Once | ORAL | Status: AC
  Administered 2022-06-20: 10 mg via ORAL
  Filled 2022-06-20: qty 3

## 2022-06-20 MED ORDER — AMOXICILLIN-POT CLAVULANATE 875-125 MG PO TABS: 1.0000 | ORAL_TABLET | Freq: Two times a day (BID) | ORAL | 0 refills | Status: DC

## 2022-06-20 MED ORDER — BENZONATATE 100 MG PO CAPS
100.0000 mg | ORAL_CAPSULE | Freq: Three times a day (TID) | ORAL | 0 refills | Status: DC
Start: 2022-06-20 — End: 2022-07-11

## 2022-06-20 MED ORDER — LIDOCAINE VISCOUS HCL 2 % MT SOLN: 15.0000 mL | OROMUCOSAL | 0 refills | Status: DC | PRN

## 2022-06-20 NOTE — ED Triage Notes (Signed)
Pt reports she has had cold symptoms for the past week and tested negative for covid and flu so her PC put her on allergy medicine.  Pt reports her eyes were red so she rinsed them out with eye wash and then her right ear became painful and her throat feels funny and she is not sure if that is due to excessive cough drops or something else.

## 2022-06-20 NOTE — Discharge Instructions (Addendum)
You were seen in the ER for evaluation of your cough and cold symptoms. You were sent in Augmentin by your PCP. Please take that to help with your symptoms. Additionally, I gave you a steroid today to help with your symptoms. You are being sent home with two medications, one is tessalon pearles to take as needed for a cough and the other is some lidocaine you can use for your mouth and tongue pain. You can also try Flonase to help with your nasal congestion. STOP USING COUGH DROPS. This is likely what has hurt your tongue. Please try bland foods without spice or citrus to help. Please make sure you are staying well hydrated drinking plenty of fluids, mainly water. Please follow up with your PCP for re-evaluation in the next few days. If you have any concerns, new or worsening symptoms, please return to the ER for re-evaluation.   Contact a doctor if: You have large, tender lumps in your neck. You have a rash. You cough up green, yellow-brown, or bloody spit. Get help right away if: You have a stiff neck. You drool or cannot swallow liquids. You cannot drink or take medicines without vomiting. You have very bad pain that does not go away with medicine. You have problems breathing, and it is not from a stuffy nose. You have new pain and swelling in your knees, ankles, wrists, or elbows. These symptoms may be an emergency. Get help right away. Call your local emergency services (911 in the [place] Do not wait to see if the symptoms will go away. Do not drive yourself to the hospital.

## 2022-06-20 NOTE — Telephone Encounter (Signed)
Patient called spoke with Dr Posey Pronto last week and was told if the medicine that was called into her pharmacy not help her to call and let provider know to get an antibiotic called in. The medicine has not helped her still feels the same today.   Pharmacy: [facility]

## 2022-06-20 NOTE — ED Provider Notes (Signed)
[facility] Provider Note   CSN: [id number] Arrival date & time: 06/20/22  1339     History Chief Complaint  Patient presents with   Ear Pain    Christy Goodman is a 27 y.o. female with h/o HTN and asthma presents to the ER for evaluation of cough and cold symptoms for the past 5 days as well as a sore tongue since today.  She reports that she was seen a few days prior by her PCP who prescribed her some allergy medication.  She reports that she really has not felt much better and is still having coughing with a sore throat.  In 3 days, she is consumed 3 bags of the Vicks vapor rub cough drops.  She reports that she started to have burning sensation in her tongue with eating.  She also reports that she has a sore throat, but her cough is her main symptom.  She denies any chest pain, shortness of breath, hemoptysis, abdominal pain, nausea, vomiting, diarrhea, constipation, or urinary symptoms.  She reports that she discussed this with her primary care doctor earlier and she is being placed on Augmentin however given her sore tongue she was concerned.  Additionally, she mentions that she had some red eyes this morning without any crusting or discharge and when she put some eyedrops in her eyes her right ear became painful after waking up from a nap later after that.  HPI     Home Medications Prior to Admission medications   Medication Sig Start Date End Date Taking? Authorizing Provider  albuterol (VENTOLIN HFA) 108 (90 Base) MCG/ACT inhaler Inhale 2 puffs into the lungs every 4 (four) hours as needed for wheezing or shortness of breath.  10/01/19   [provider]  amoxicillin-clavulanate (AUGMENTIN) 875-125 MG tablet Take 1 tablet by mouth 2 (two) times daily. 06/20/22   Lindell Spar, MD  Chlorphen-PE-Acetaminophen (NOREL AD) 4-10-325 MG TABS Take 1 tablet by mouth 3 (three) times daily as needed (Nasal congestion). 06/15/22   Lindell Spar, MD  lisinopril (ZESTRIL) 5 MG tablet TAKE ONE TABLET ('5MG'$  TOTAL) BY MOUTH DAILY 10/18/21   Paseda, Dewaine Conger, FNP  norethindrone (ERRIN) 0.35 MG tablet Take 1 tablet (0.35 mg total) by mouth daily. 06/14/22   Alvira Monday, [place]      Allergies    Patient has no known allergies.    Review of Systems   Review of Systems  Constitutional:  Negative for chills and fever.  HENT:  Positive for congestion, rhinorrhea and sore throat. Negative for drooling, ear pain and trouble swallowing.   Eyes:  Negative for photophobia, discharge and visual disturbance.  Respiratory:  Positive for cough. Negative for shortness of breath.   Cardiovascular:  Negative for chest pain and palpitations.  Gastrointestinal:  Negative for abdominal pain, diarrhea, nausea and vomiting.  Genitourinary:  Negative for dysuria and hematuria.  Musculoskeletal:  Negative for arthralgias, back pain and joint swelling.  Skin:  Negative for color change and rash.  Neurological:  Negative for syncope and weakness.    Physical Exam Updated Vital Signs BP (!) 146/98 (BP Location: Right Arm)   Pulse 100   Temp 98.4 F (36.9 C) (Oral)   Resp 20   Ht '5\' 3"'$  (1.6 m)   Wt 110.2 kg   LMP 06/11/2022   SpO2 95%   BMI 43.05 kg/m  Physical Exam Vitals and nursing note reviewed.  Constitutional:  General: She is not in acute distress.    Appearance: She is not toxic-appearing.  HENT:     Head: Normocephalic and atraumatic.     Right Ear: Tympanic membrane, ear canal and external ear normal.     Left Ear: Tympanic membrane, ear canal and external ear normal.     Nose:     Comments: Bilateral nasal turbinate edema and erythema with scant clear nasal discharge.    Mouth/Throat:     Mouth: Mucous membranes are moist.     Comments: Tonsils have erythema with small exudate.  Tonsils are 2+ bilaterally.  Uvula is midline.  Airway patent.  Moist mucous membranes.  Controlling secretions.  She has some whiteness to  the middle of tongue that is not able to be removed with a swab.  It has some tenderness to palpation. Eyes:     General: No scleral icterus.       Right eye: No discharge.        Left eye: No discharge.     Conjunctiva/sclera: Conjunctivae normal.  Cardiovascular:     Rate and Rhythm: Normal rate.  Pulmonary:     Effort: Pulmonary effort is normal. No respiratory distress.     Breath sounds: Normal breath sounds. No stridor.  Musculoskeletal:        General: No deformity.     Cervical back: Normal range of motion. No rigidity.  Lymphadenopathy:     Cervical: No cervical adenopathy.  Skin:    Findings: No rash.  Neurological:     General: No focal deficit present.     Mental Status: She is alert.     ED Results / Procedures / Treatments   Labs (all labs ordered are listed, but only abnormal results are displayed) Labs Reviewed  RESP PANEL BY RT-PCR (RSV, FLU A&B, COVID)  [id number]  GROUP A STREP BY PCR    EKG None  Radiology No results found.  Procedures Procedures   Medications Ordered in ED Medications - No data to display  ED Course/ Medical Decision Making/ A&P                            Medical Decision Making Risk Prescription drug management.   27 year old female presents emerged from today for evaluation of her cough and cold symptoms, sore throat, and tongue pain.  Differential diagnosis includes was not limited to COVID, flu, RSV, viral illness, strep throat, peritonsillar abscess, thrush, burn, lichen planus, oral cancer.  Vital signs show slightly elevated blood pressure will reassess for 98 otherwise afebrile, normal heart rate, satting well on room air without increased work of breathing.  Physical exam as noted above.  Patient tested negative for COVID, flu, RSV, and strep.  She does have some tonsillar hypertrophy with some exudates, could be viral in nature given her repeated negative testing.  I suspect her tongue has a superficial burn to it  given the tenderness and the fact that she has had 3 bags of Vicks VaporRub cough drops in less than 3 days time.  We discussed stopping the use of the cough drops.  Instead, I will give her some Tessalon Perles and some lidocaine viscous solution to help with the oral pain.  Her tongue does not appear to be thrush like as it is not able to be removed.  Not really consistent with any leukoplakia.  This is likely is superficial chemical burn from the amount of cough drops  she was eating. Instructed the patient to follow-up with her primary doctor.  Discussed to take the medication like she was given  Discussed with patient to follow-up with her primary care doctor as instructed.  She can the [employer] and viscous lidocaine to help with her throat and her tongue pain.  We discussed return precautions and red flag symptoms.  Patient verbalized understanding agrees the plan.  Patient is stable being discharged home in good condition.  Final Clinical Impression(s) / ED Diagnoses Final diagnoses:  Sore throat  Tongue pain  Right ear pain  Acute cough    Rx / DC Orders ED Discharge Orders          Ordered    benzonatate (TESSALON) 100 MG capsule  Every 8 hours        06/20/22 1616    lidocaine (XYLOCAINE) 2 % solution  As needed        06/20/22 1616              Sherrell Puller, [place] 06/22/22 1537    Sherwood Gambler, MD 06/23/22 2313

## 2022-06-20 NOTE — Telephone Encounter (Signed)
Patient advised.

## 2022-07-03 ENCOUNTER — Telehealth: Payer: Self-pay | Admitting: Family Medicine

## 2022-07-03 ENCOUNTER — Other Ambulatory Visit: Payer: Self-pay | Admitting: Family Medicine

## 2022-07-03 DIAGNOSIS — B3731 Acute candidiasis of vulva and vagina: Secondary | ICD-10-CM

## 2022-07-03 MED ORDER — FLUCONAZOLE 150 MG PO TABS: 150.0000 mg | ORAL_TABLET | Freq: Once | ORAL | 0 refills | Status: AC

## 2022-07-03 NOTE — Telephone Encounter (Signed)
Patient called just had a visit 01.18.2024 and took antibiotic and now has a yeast infection. Please call patient and leave voicemail, patient is at work, if there is something can be called into her pharmacy.

## 2022-07-03 NOTE — Telephone Encounter (Signed)
A prescription for Diflucan has been sent to her pharmacy

## 2022-07-03 NOTE — Telephone Encounter (Signed)
Pt informed

## 2022-07-11 ENCOUNTER — Ambulatory Visit (INDEPENDENT_AMBULATORY_CARE_PROVIDER_SITE_OTHER): Payer: Managed Care, Other (non HMO) | Admitting: Family Medicine

## 2022-07-11 ENCOUNTER — Encounter: Payer: Self-pay | Admitting: Family Medicine

## 2022-07-11 DIAGNOSIS — Z30011 Encounter for initial prescription of contraceptive pills: Secondary | ICD-10-CM

## 2022-07-11 MED ORDER — NORETHINDRONE 0.35 MG PO TABS: 1.0000 | ORAL_TABLET | Freq: Every day | ORAL | 1 refills | Status: DC

## 2022-07-11 NOTE — Progress Notes (Signed)
Acute Office Visit  Subjective:    Patient ID: Christy Goodman, female    DOB: 02/10/1996, 26 y.o.   MRN: [id number]  Chief Complaint  Patient presents with   Menstrual Problem    Pt reports spotting since she started birth control. Did take a round of antibiotic and had a yeast infection.     HPI Patient is in today with complaints of spotting while on the minipill.  For the details of today's visit, please refer to the assessment and plan.     Past Medical History:  Diagnosis Date   Allergies    Asthma    Gonorrhea 06/06/2016   Gonorrhea 06/06/2016   H/O seasonal allergies    Hypertension     History reviewed. No pertinent surgical history.  Family History  Problem Relation Age of Onset   Cancer Paternal Grandmother    Diabetes Paternal Grandmother    Diabetes Paternal Aunt     Social History   Socioeconomic History   Marital status: Single    Spouse name: Not on file   Number of children: Not on file   Years of education: Not on file   Highest education level: Not on file  Occupational History   Not on file  Tobacco Use   Smoking status: Former    Packs/day: 0.25    Years: 1.00    Total pack years: 0.25    Types: Cigarettes, Cigars   Smokeless tobacco: Never  Vaping Use   Vaping Use: Never used  Substance and Sexual Activity   Alcohol use: Yes    Comment: occ; tequila   Drug use: Yes    Types: Marijuana    Comment: 3 joints per day   Sexual activity: Yes    Birth control/protection: None    Comment: sexual partner is female- no chance of pregnancy  Other Topics Concern   Not on file  Social History Narrative   Not on file   Social Determinants of Health   Financial Resource Strain: Low Risk  (02/03/2021)   Overall Financial Resource Strain (CARDIA)    Difficulty of Paying Living Expenses: Not hard at all  Food Insecurity: No Food Insecurity (02/03/2021)   Hunger Vital Sign    Worried About Running Out of Food in the Last Year: Never true     [place] in the Last Year: Never true  Transportation Needs: No Transportation Needs (02/03/2021)   PRAPARE - Hydrologist (Medical): No    Lack of Transportation (Non-Medical): No  Physical Activity: Sufficiently Active (02/03/2021)   Exercise Vital Sign    Days of Exercise per Week: 5 days    Minutes of Exercise per Session: 30 min  Stress: No Stress Concern Present (02/03/2021)   [place]    Feeling of Stress : Not at all  Social Connections: Socially Isolated (02/03/2021)   Social Connection and Isolation Panel [NHANES]    Frequency of Communication with Friends and Family: More than three times a week    Frequency of Social Gatherings with Friends and Family: Twice a week    Attends Religious Services: Never    Marine scientist or Organizations: No    Attends Archivist Meetings: Never    Marital Status: Never married  Intimate Partner Violence: Not At Risk (02/03/2021)   Humiliation, Afraid, Rape, and Kick questionnaire    Fear of Current or Ex-Partner: No  Emotionally Abused: No    Physically Abused: No    Sexually Abused: No    Outpatient Medications Prior to Visit  Medication Sig Dispense Refill   albuterol (VENTOLIN HFA) 108 (90 Base) MCG/ACT inhaler Inhale 2 puffs into the lungs every 4 (four) hours as needed for wheezing or shortness of breath.      lidocaine (XYLOCAINE) 2 % solution Use as directed 15 mLs in the mouth or throat as needed for mouth pain. 100 mL 0   lisinopril (ZESTRIL) 5 MG tablet TAKE ONE TABLET (5MG TOTAL) BY MOUTH DAILY 90 tablet 1   norethindrone (ERRIN) 0.35 MG tablet Take 1 tablet (0.35 mg total) by mouth daily. 84 tablet 1   amoxicillin-clavulanate (AUGMENTIN) 875-125 MG tablet Take 1 tablet by mouth 2 (two) times daily. 14 tablet 0   benzonatate (TESSALON) 100 MG capsule Take 1 capsule (100 mg total) by mouth every 8 (eight) hours. 14  capsule 0   Chlorphen-PE-Acetaminophen (NOREL AD) 4-10-325 MG TABS Take 1 tablet by mouth 3 (three) times daily as needed (Nasal congestion). 30 tablet 0   No facility-administered medications prior to visit.    No Known Allergies  Review of Systems  Constitutional:  Negative for chills and fever.  Eyes:  Negative for visual disturbance.  Respiratory:  Negative for chest tightness and shortness of breath.   Genitourinary:  Positive for vaginal bleeding (spotting).  Neurological:  Negative for dizziness and headaches.       Objective:    Physical Exam HENT:     Head: Normocephalic.     Mouth/Throat:     Mouth: Mucous membranes are moist.  Cardiovascular:     Rate and Rhythm: Normal rate.     Heart sounds: Normal heart sounds.  Pulmonary:     Effort: Pulmonary effort is normal.     Breath sounds: Normal breath sounds.  Neurological:     Mental Status: She is alert.     BP 126/78 (BP Location: Left Arm)   Pulse 94   Ht 5' 3"$  (1.6 m)   Wt 242 lb 0.6 oz (109.8 kg)   LMP 06/11/2022   SpO2 97%   BMI 42.88 kg/m  Wt Readings from Last 3 Encounters:  07/11/22 242 lb 0.6 oz (109.8 kg)  06/20/22 243 lb (110.2 kg)  06/15/22 242 lb 9.6 oz (110 kg)       Assessment & Plan:  Oral contraception initiation Assessment & Plan: Patient report spotting since starting the minipill She was started on the minipill on 06/14/2022 She reports taking Augmentin on 06/20/2022 and Diflucan thereafter for a yeast infection She reports spotting since her last menstrual cycle on 06/14/2022 She denies severe lower abdominal pain, and severe headaches Inform patient that the minipill may cause unpredictable bleeding for the first few months.  You may experience spotting, heavy bleeding or no bleeding at all.  Irregular bleeding typically resolves within 6 months Patient verbalized understanding  Orders: -     Norethindrone; Take 1 tablet (0.35 mg total) by mouth daily.  Dispense: 84 tablet;  Refill: [place], FNP

## 2022-07-11 NOTE — Assessment & Plan Note (Signed)
Patient report spotting since starting the minipill She was started on the minipill on 06/14/2022 She reports taking Augmentin on 06/20/2022 and Diflucan thereafter for a yeast infection She reports spotting since her last menstrual cycle on 06/14/2022 She denies severe lower abdominal pain, and severe headaches Inform patient that the minipill may cause unpredictable bleeding for the first few months.  You may experience spotting, heavy bleeding or no bleeding at all.  Irregular bleeding typically resolves within 6 months Patient verbalized understanding

## 2022-07-11 NOTE — Patient Instructions (Addendum)
I appreciate the opportunity to provide care to you today!    Follow up:  09/13/2022  The minipill may cause unpredictable bleeding for the first few months.  You may experience spotting, heavy bleeding or no bleeding at all.  Irregular bleeding typically resolves within 6 months    Please continue to a heart-healthy diet and increase your physical activities. Try to exercise for 7mns at least five times a week.      It was a pleasure to see you and I look forward to continuing to work together on your health and well-being. Please do not hesitate to call the office if you need care or have questions about your care.   Have a wonderful day and week. With Gratitude, GAlvira MondayMSN, FNP-BC

## 2022-08-01 ENCOUNTER — Other Ambulatory Visit: Payer: Self-pay

## 2022-08-01 DIAGNOSIS — I10 Essential (primary) hypertension: Secondary | ICD-10-CM

## 2022-08-01 MED ORDER — LISINOPRIL 5 MG PO TABS: ORAL_TABLET | ORAL | 1 refills | Status: DC

## 2022-08-21 ENCOUNTER — Ambulatory Visit: Payer: Managed Care, Other (non HMO) | Admitting: Adult Health

## 2022-08-21 ENCOUNTER — Encounter: Payer: Self-pay | Admitting: Adult Health

## 2022-08-21 VITALS — BP 143/90 | HR 99 | Ht 63.0 in | Wt 245.0 lb

## 2022-08-21 DIAGNOSIS — N938 Other specified abnormal uterine and vaginal bleeding: Secondary | ICD-10-CM | POA: Insufficient documentation

## 2022-08-21 DIAGNOSIS — I10 Essential (primary) hypertension: Secondary | ICD-10-CM

## 2022-08-21 DIAGNOSIS — Z3202 Encounter for pregnancy test, result negative: Secondary | ICD-10-CM

## 2022-08-21 DIAGNOSIS — Z113 Encounter for screening for infections with a predominantly sexual mode of transmission: Secondary | ICD-10-CM | POA: Insufficient documentation

## 2022-08-21 DIAGNOSIS — Z3041 Encounter for surveillance of contraceptive pills: Secondary | ICD-10-CM | POA: Diagnosis not present

## 2022-08-21 LAB — POCT URINE PREGNANCY: Preg Test, Ur: NEGATIVE

## 2022-08-21 MED ORDER — SLYND 4 MG PO TABS: 1.0000 | ORAL_TABLET | Freq: Every day | ORAL | 0 refills | Status: DC

## 2022-08-21 NOTE — Progress Notes (Signed)
  Subjective:     Patient ID: Christy Goodman, female   DOB: 1995/12/21, 27 y.o.   MRN: [id number]  HPI Christy Goodman is a 27 year old black female,single, G0P0, in complaining of bleeding on Micronor, she started the pill in January and has had bleeding since. Last sex was in December with man.  Last pap was negative HPV and NILM 06/30/20  PCP is Alvira Monday NP.  Review of Systems Bleeding with Micronor Denies any pain No sex since December Reviewed past medical,surgical, social and family history. Reviewed medications and allergies.     Objective:   Physical Exam BP (!) 143/90 (BP Location: Right Arm, Patient Position: Sitting, Cuff Size: Large)   Pulse 99   Ht 5\' 3"  (1.6 m)   Wt 245 lb (111.1 kg)   LMP 08/14/2022 (Approximate)   BMI 43.40 kg/m  UPT is negative.   Skin warm and dry.  Lungs: clear to ausculation bilaterally. Cardiovascular: regular rate and rhythm.  Fall risk is low  Upstream - 08/21/22 1056       Pregnancy Intention Screening   Does the patient want to become pregnant in the next year? No    Does the patient's partner want to become pregnant in the next year? No    Would the patient like to discuss contraceptive options today? Yes      Contraception Wrap Up   Current Method Oral Contraceptive    End Method Oral Contraceptive                Assessment:     1. Encounter for surveillance of contraceptive pills Meds ordered this encounter  Medications   Drospirenone (SLYND) 4 MG TABS    Sig: Take 1 tablet (4 mg total) by mouth daily.    Dispense:  56 tablet    Refill:  0    Order Specific Question:   Supervising Provider    Answer:   Elonda Husky, LUTHER H [[id number]]     2. DUB (dysfunctional uterine bleeding) +bleeding on Micronor since January Will change to [facility], can start in am, if has sex use a condoms Gave 2 sample packs    3. Hypertension, essential Take meds and follow up with PCP as scheduled   4. Screening examination for STD (sexually  transmitted disease) Urine sent for GC/CHL She declines blood labs  5. Pregnancy examination or test, negative result     Plan:     Follow up in 6 weeks for ROS

## 2022-08-23 LAB — GC/CHLAMYDIA PROBE AMP
Chlamydia trachomatis, NAA: NEGATIVE
Neisseria Gonorrhoeae by PCR: NEGATIVE

## 2022-09-13 ENCOUNTER — Ambulatory Visit (INDEPENDENT_AMBULATORY_CARE_PROVIDER_SITE_OTHER): Payer: Managed Care, Other (non HMO) | Admitting: Family Medicine

## 2022-09-13 ENCOUNTER — Encounter: Payer: Self-pay | Admitting: Family Medicine

## 2022-09-13 ENCOUNTER — Ambulatory Visit: Payer: Managed Care, Other (non HMO) | Admitting: Family Medicine

## 2022-09-13 VITALS — BP 124/81 | HR 92 | Ht 63.0 in | Wt 240.1 lb

## 2022-09-13 DIAGNOSIS — I10 Essential (primary) hypertension: Secondary | ICD-10-CM

## 2022-09-13 DIAGNOSIS — M25561 Pain in right knee: Secondary | ICD-10-CM

## 2022-09-13 NOTE — Patient Instructions (Signed)
I appreciate the opportunity to provide care to you today!    Follow up:  3 months  Labs: next visit     Please continue to a heart-healthy diet and increase your physical activities. Try to exercise for 30mins at least five days a week.      It was a pleasure to see you and I look forward to continuing to work together on your health and well-being. Please do not hesitate to call the office if you need care or have questions about your care.   Have a wonderful day and week. With Gratitude, Janet Humphreys MSN, FNP-BC  

## 2022-09-13 NOTE — Progress Notes (Unsigned)
Established Patient Office Visit  Subjective:  Patient ID: Christy Goodman, female    DOB: August 02, 1995  Age: 27 y.o. MRN: [id number]  CC:  Chief Complaint  Patient presents with   Follow-up    3 month f/u, pt reports right knee pain due to work worse in the mornings, onset of this began 08/09/2022.     HPI Christy Goodman is a 27 y.o. female with past medical history of hypertension presents for f/u of  chronic medical conditions.  Right knee pain: Complaints of right knee pain.  No recent injury or trauma.  Pain is noted in the morning when she wakes up and subsides as she is ambulatory.  No inflammatory symptoms reported.  No systematic symptoms were reported.  No notable deformity visualized.  No impairments in ADLs or mobility.  Range of motion is intact.  Patient reports  kneeling on the affected knee frequently at work.  No catching or locking sensation reported.   Past Medical History:  Diagnosis Date   Allergies    Asthma    Gonorrhea 06/06/2016   Gonorrhea 06/06/2016   H/O seasonal allergies    Hypertension     History reviewed. No pertinent surgical history.  Family History  Problem Relation Age of Onset   Cancer Paternal Grandmother    Diabetes Paternal Grandmother    Diabetes Paternal Aunt     Social History   Socioeconomic History   Marital status: Single    Spouse name: Not on file   Number of children: Not on file   Years of education: Not on file   Highest education level: Not on file  Occupational History   Not on file  Tobacco Use   Smoking status: Former    Packs/day: 0.25    Years: 1.00    Additional pack years: 0.00    Total pack years: 0.25    Types: Cigarettes, Cigars   Smokeless tobacco: Never  Vaping Use   Vaping Use: Never used  Substance and Sexual Activity   Alcohol use: Yes    Comment: occ; tequila   Drug use: Yes    Types: Marijuana    Comment: 3 joints per day   Sexual activity: Not Currently    Birth control/protection:  None, Pill  Other Topics Concern   Not on file  Social History Narrative   Not on file   Social Determinants of Health   Financial Resource Strain: Low Risk  (02/03/2021)   Overall Financial Resource Strain (CARDIA)    Difficulty of Paying Living Expenses: Not hard at all  Food Insecurity: No Food Insecurity (02/03/2021)   Hunger Vital Sign    Worried About Running Out of Food in the Last Year: Never true    Ran Out of Food in the Last Year: Never true  Transportation Needs: No Transportation Needs (02/03/2021)   PRAPARE - Administrator, Civil Service (Medical): No    Lack of Transportation (Non-Medical): No  Physical Activity: Sufficiently Active (02/03/2021)   Exercise Vital Sign    Days of Exercise per Week: 5 days    Minutes of Exercise per Session: 30 min  Stress: No Stress Concern Present (02/03/2021)   [employer] of Occupational Health - Occupational Stress Questionnaire    Feeling of Stress : Not at all  Social Connections: Socially Isolated (02/03/2021)   Social Connection and Isolation Panel [NHANES]    Frequency of Communication with Friends and Family: More than three times a week  Frequency of Social Gatherings with Friends and Family: Twice a week    Attends Religious Services: Never    Database administrator or Organizations: No    Attends Banker Meetings: Never    Marital Status: Never married  Intimate Partner Violence: Not At Risk (02/03/2021)   Humiliation, Afraid, Rape, and Kick questionnaire    Fear of Current or Ex-Partner: No    Emotionally Abused: No    Physically Abused: No    Sexually Abused: No    Outpatient Medications Prior to Visit  Medication Sig Dispense Refill   albuterol (VENTOLIN HFA) 108 (90 Base) MCG/ACT inhaler Inhale 2 puffs into the lungs every 4 (four) hours as needed for wheezing or shortness of breath.      Drospirenone (SLYND) 4 MG TABS Take 1 tablet (4 mg total) by mouth daily. 56 tablet 0   lisinopril  (ZESTRIL) 5 MG tablet TAKE ONE TABLET (  TOTAL) BY MOUTH DAILY 30 tablet 1   No facility-administered medications prior to visit.    No Known Allergies  ROS Review of Systems  Constitutional:  Negative for chills and fever.  Eyes:  Negative for visual disturbance.  Respiratory:  Negative for chest tightness and shortness of breath.   Gastrointestinal:  Negative for anal bleeding (right knee pain).  Neurological:  Negative for dizziness and headaches.      Objective:    Physical Exam HENT:     Head: Normocephalic.     Mouth/Throat:     Mouth: Mucous membranes are moist.  Cardiovascular:     Rate and Rhythm: Normal rate.     Heart sounds: Normal heart sounds.  Pulmonary:     Effort: Pulmonary effort is normal.     Breath sounds: Normal breath sounds.  Musculoskeletal:     Right knee: No swelling, deformity, erythema or ecchymosis. Normal range of motion. No tenderness.     Left knee: No swelling, deformity, erythema or ecchymosis. Normal range of motion. No tenderness.  Neurological:     Mental Status: She is alert.     BP 124/81   Pulse 92   Ht  (1.6 m)   Wt 240 lb 1.3 oz (108.9 kg)   LMP 08/14/2022 (Approximate)   SpO2 98%   BMI 42.53 kg/m  Wt Readings from Last 3 Encounters:  09/13/22 240 lb 1.3 oz (108.9 kg)  08/21/22 245 lb (111.1 kg)  07/11/22 242 lb 0.6 oz (109.8 kg)    Lab Results  Component Value Date   TSH 1.060 06/16/2022   Lab Results  Component Value Date   WBC 14.8 (H) 06/16/2022   HGB 12.2 06/16/2022   HCT 35.6 06/16/2022   MCV 86 06/16/2022   PLT 318 06/16/2022   Lab Results  Component Value Date   NA 139 06/16/2022   K 4.1 06/16/2022   CO2 22 06/16/2022   GLUCOSE 92 06/16/2022   BUN 8 06/16/2022   CREATININE 0.93 06/16/2022   BILITOT 0.4 06/16/2022   ALKPHOS 78 06/16/2022   AST 17 06/16/2022   ALT 22 06/16/2022   PROT 7.0 06/16/2022   ALBUMIN 4.2 06/16/2022   CALCIUM 9.2 06/16/2022   ANIONGAP 7 06/05/2020   EGFR 86  06/16/2022   Lab Results  Component Value Date   CHOL 103 06/16/2022   Lab Results  Component Value Date   HDL 46 06/16/2022   Lab Results  Component Value Date   LDLCALC 40 06/16/2022   Lab Results  Component  Value Date   TRIG 82 06/16/2022   Lab Results  Component Value Date   CHOLHDL 2.2 06/16/2022   Lab Results  Component Value Date   HGBA1C 5.9 (H) 06/16/2022      Assessment & Plan:  Hypertension, essential Assessment & Plan: Controlled She takes lisinopril 5 mg daily Denies headaches, dizziness, blurred vision, chest pain, palpitation Low-sodium diet with increased physical activity encouraged BP Readings from Last 3 Encounters:  09/13/22 124/81  08/21/22 (!) 143/90  07/11/22 126/78      Acute pain of right knee Assessment & Plan: Likely due to overuse  Encouraged use of knee brace Encouraged to take over-the-counter analgesic as needed for pain Will continue to monitor     Follow-up: No follow-ups on file.   Gilmore Laroche, FNP

## 2022-09-14 DIAGNOSIS — M25561 Pain in right knee: Secondary | ICD-10-CM | POA: Insufficient documentation

## 2022-09-14 NOTE — Assessment & Plan Note (Signed)
Controlled She takes lisinopril 5 mg daily Denies headaches, dizziness, blurred vision, chest pain, palpitation Low-sodium diet with increased physical activity encouraged BP Readings from Last 3 Encounters:  09/13/22 124/81  08/21/22 (!) 143/90  07/11/22 126/78

## 2022-09-14 NOTE — Assessment & Plan Note (Signed)
Likely due to overuse  Encouraged use of knee brace Encouraged to take over-the-counter analgesic as needed for pain Will continue to monitor

## 2022-09-18 ENCOUNTER — Telehealth: Payer: Self-pay | Admitting: Adult Health

## 2022-09-18 MED ORDER — SLYND 4 MG PO TABS: 1.0000 | ORAL_TABLET | Freq: Every day | ORAL | 11 refills | Status: DC

## 2022-09-18 NOTE — Telephone Encounter (Signed)
Rx sent for slynd to [facility] pharmacy

## 2022-09-18 NOTE — Telephone Encounter (Signed)
Pt is requesting birth control to be sent to [facility].

## 2022-10-02 ENCOUNTER — Other Ambulatory Visit: Payer: Self-pay | Admitting: Family Medicine

## 2022-10-02 DIAGNOSIS — I10 Essential (primary) hypertension: Secondary | ICD-10-CM

## 2022-10-09 ENCOUNTER — Other Ambulatory Visit: Payer: Self-pay

## 2022-10-09 ENCOUNTER — Emergency Department (HOSPITAL_COMMUNITY)
Admission: EM | Admit: 2022-10-09 | Discharge: 2022-10-09 | Disposition: A | Discharge status: Home / Self Care | Payer: Managed Care, Other (non HMO) | Attending: Emergency Medicine | Admitting: Emergency Medicine

## 2022-10-09 ENCOUNTER — Encounter (HOSPITAL_COMMUNITY): Payer: Self-pay

## 2022-10-09 DIAGNOSIS — J45909 Unspecified asthma, uncomplicated: Secondary | ICD-10-CM | POA: Insufficient documentation

## 2022-10-09 DIAGNOSIS — Z79899 Other long term (current) drug therapy: Secondary | ICD-10-CM | POA: Insufficient documentation

## 2022-10-09 DIAGNOSIS — R112 Nausea with vomiting, unspecified: Secondary | ICD-10-CM | POA: Diagnosis not present

## 2022-10-09 DIAGNOSIS — R000 Tachycardia, unspecified: Secondary | ICD-10-CM | POA: Insufficient documentation

## 2022-10-09 DIAGNOSIS — R197 Diarrhea, unspecified: Secondary | ICD-10-CM | POA: Diagnosis present

## 2022-10-09 DIAGNOSIS — E860 Dehydration: Secondary | ICD-10-CM | POA: Diagnosis not present

## 2022-10-09 DIAGNOSIS — I10 Essential (primary) hypertension: Secondary | ICD-10-CM | POA: Diagnosis not present

## 2022-10-09 LAB — PREGNANCY, URINE: Preg Test, Ur: NEGATIVE

## 2022-10-09 LAB — COMPREHENSIVE METABOLIC PANEL
ALT: 30 U/L (ref 0–44)
AST: 25 U/L (ref 15–41)
Albumin: 4.2 g/dL (ref 3.5–5.0)
Alkaline Phosphatase: 57 U/L (ref 38–126)
Anion gap: 11 (ref 5–15)
BUN: 16 mg/dL (ref 6–20)
CO2: 23 mmol/L (ref 22–32)
Calcium: 9.3 mg/dL (ref 8.9–10.3)
Chloride: 100 mmol/L (ref 98–111)
Creatinine, Ser: 1.08 mg/dL — ABNORMAL HIGH (ref 0.44–1.00)
GFR, Estimated: >60 mL/min (ref >60)
Glucose, Bld: 118 mg/dL — ABNORMAL HIGH (ref 70–99)
Potassium: 3.4 mmol/L — ABNORMAL LOW (ref 3.5–5.1)
Sodium: 134 mmol/L — ABNORMAL LOW (ref 135–145)
Total Bilirubin: 0.9 mg/dL (ref 0.3–1.2)
Total Protein: 8 g/dL (ref 6.5–8.1)

## 2022-10-09 LAB — URINALYSIS, ROUTINE W REFLEX MICROSCOPIC
Bilirubin Urine: NEGATIVE
Glucose, UA: NEGATIVE mg/dL
Ketones, ur: NEGATIVE mg/dL
Leukocytes,Ua: NEGATIVE
Nitrite: NEGATIVE
Protein, ur: 30 mg/dL — AB
Specific Gravity, Urine: 1.032 — ABNORMAL HIGH (ref 1.005–1.030)
pH: 5 (ref 5.0–8.0)

## 2022-10-09 LAB — CBC WITH DIFFERENTIAL/PLATELET
Abs Immature Granulocytes: 0.03 10*3/uL (ref 0.00–0.07)
Basophils Absolute: 0 10*3/uL (ref 0.0–0.1)
Basophils Relative: 0 %
Eosinophils Absolute: 0 10*3/uL (ref 0.0–0.5)
Eosinophils Relative: 0 %
HCT: 42.3 % (ref 36.0–46.0)
Hemoglobin: 14.1 g/dL (ref 12.0–15.0)
Immature Granulocytes: 0 %
Lymphocytes Relative: 12 %
Lymphs Abs: 1.2 10*3/uL (ref 0.7–4.0)
MCH: 29.1 pg (ref 26.0–34.0)
MCHC: 33.3 g/dL (ref 30.0–36.0)
MCV: 87.4 fL (ref 80.0–100.0)
Monocytes Absolute: 0.4 10*3/uL (ref 0.1–1.0)
Monocytes Relative: 4 %
Neutro Abs: 8.3 10*3/uL — ABNORMAL HIGH (ref 1.7–7.7)
Neutrophils Relative %: 84 %
Platelets: 334 10*3/uL (ref 150–400)
RBC: 4.84 MIL/uL (ref 3.87–5.11)
RDW: 14 % (ref 11.5–15.5)
WBC: 9.9 10*3/uL (ref 4.0–10.5)
nRBC: 0 % (ref 0.0–0.2)

## 2022-10-09 LAB — LIPASE, BLOOD: Lipase: 28 U/L (ref 11–51)

## 2022-10-09 MED ORDER — ONDANSETRON 4 MG PO TBDP: 4.0000 mg | ORAL_TABLET | Freq: Three times a day (TID) | ORAL | 0 refills | Status: DC | PRN

## 2022-10-09 MED ORDER — SODIUM CHLORIDE 0.9 % IV BOLUS
1000.0000 mL | Freq: Once | INTRAVENOUS | Status: AC
  Administered 2022-10-09: 1000 mL via INTRAVENOUS

## 2022-10-09 MED ORDER — KETOROLAC TROMETHAMINE 30 MG/ML IJ SOLN
30.0000 mg | Freq: Once | INTRAMUSCULAR | Status: AC
  Administered 2022-10-09: 30 mg via INTRAVENOUS
  Filled 2022-10-09: qty 1

## 2022-10-09 MED ORDER — ONDANSETRON HCL 4 MG/2ML IJ SOLN
4.0000 mg | Freq: Once | INTRAMUSCULAR | Status: AC
  Administered 2022-10-09: 4 mg via INTRAVENOUS
  Filled 2022-10-09: qty 2

## 2022-10-09 NOTE — ED Provider Notes (Signed)
[place] [facility] AT [facility] Provider Note   CSN: [id number] Arrival date & time: 10/09/22  0636     History  Chief Complaint  Patient presents with   Emesis    Christy Goodman is a 27 y.o. female.  Pt is a 27 yo female with pmhx significant for asthma and htn.  Pt said she's had vomiting and diarrhea since yesterday.  She has not been able to keep anything down.  She said she's been vomiting and having diarrhea at the same time.  She has some pain now in her abdomen.  No fever.  No recent abx.  No one else sick at home.       Home Medications Prior to Admission medications   Medication Sig Start Date End Date Taking? Authorizing Provider  lisinopril (ZESTRIL) 5 MG tablet TAKE ONE TABLET (5MG  TOTAL) BY MOUTH DAILY Patient taking differently: Take 5 mg by mouth daily. 10/02/22  Yes Gilmore Laroche, FNP  ondansetron (ZOFRAN-ODT) 4 MG disintegrating tablet Take 1 tablet (4 mg total) by mouth every 8 (eight) hours as needed. 10/09/22  Yes Jacalyn Lefevre, MD  Drospirenone (SLYND) 4 MG TABS Take 1 tablet (4 mg total) by mouth daily. 09/18/22   Adline Potter, NP      Allergies    Patient has no known allergies.    Review of Systems   Review of Systems  Gastrointestinal:  Positive for abdominal pain, diarrhea, nausea and vomiting.  All other systems reviewed and are negative.   Physical Exam Updated Vital Signs BP (!) 121/96   Pulse (!) 116   Temp 98.4 F (36.9 C)   Resp 18   Wt 108.9 kg   SpO2 98%   BMI 42.51 kg/m  Physical Exam Vitals and nursing note reviewed.  Constitutional:      Appearance: Normal appearance. She is obese.  HENT:     Head: Normocephalic and atraumatic.     Right Ear: External ear normal.     Left Ear: External ear normal.     Nose: Nose normal.     Mouth/Throat:     Mouth: Mucous membranes are dry.  Eyes:     Extraocular Movements: Extraocular movements intact.     Conjunctiva/sclera: Conjunctivae normal.      Pupils: Pupils are equal, round, and reactive to light.  Cardiovascular:     Rate and Rhythm: Regular rhythm. Tachycardia present.     Pulses: Normal pulses.     Heart sounds: Normal heart sounds.  Pulmonary:     Effort: Pulmonary effort is normal.     Breath sounds: Normal breath sounds.  Abdominal:     General: Abdomen is flat. Bowel sounds are normal.     Palpations: Abdomen is soft.  Musculoskeletal:        General: Normal range of motion.     Cervical back: Normal range of motion and neck supple.  Skin:    General: Skin is warm.     Capillary Refill: Capillary refill takes less than 2 seconds.  Neurological:     General: No focal deficit present.     Mental Status: She is alert.  Psychiatric:        Mood and Affect: Mood normal.        Behavior: Behavior normal.     ED Results / Procedures / Treatments   Labs (all labs ordered are listed, but only abnormal results are displayed) Labs Reviewed  CBC WITH DIFFERENTIAL/PLATELET - Abnormal;  Notable for the following components:      Result Value   Neutro Abs 8.3 (*)    All other components within normal limits  COMPREHENSIVE METABOLIC PANEL - Abnormal; Notable for the following components:   Sodium 134 (*)    Potassium 3.4 (*)    Glucose, Bld 118 (*)    Creatinine, Ser 1.08 (*)    All other components within normal limits  URINALYSIS, ROUTINE W REFLEX MICROSCOPIC - Abnormal; Notable for the following components:   APPearance CLOUDY (*)    Specific Gravity, Urine 1.032 (*)    Hgb urine dipstick LARGE (*)    Protein, ur 30 (*)    Bacteria, UA RARE (*)    All other components within normal limits  GASTROINTESTINAL PANEL BY PCR, STOOL (REPLACES STOOL CULTURE)  C DIFFICILE QUICK SCREEN W PCR REFLEX    LIPASE, BLOOD  PREGNANCY, URINE    EKG None  Radiology No results found.  Procedures Procedures    Medications Ordered in ED Medications  ketorolac (TORADOL) 30 MG/ML injection 30 mg (has no  administration in time range)  ondansetron (ZOFRAN) injection 4 mg (4 mg Intravenous Given 10/09/22 0711)  sodium chloride 0.9 % bolus 1,000 mL (1,000 mLs Intravenous New Bag/Given 10/09/22 0710)    ED Course/ Medical Decision Making/ A&P                             Medical Decision Making Amount and/or Complexity of Data Reviewed Labs: ordered.  Risk Prescription drug management.   This patient presents to the ED for concern of n/v/d, this involves an extensive number of treatment options, and is a complaint that carries with it a high risk of complications and morbidity.  The differential diagnosis includes gastroenteritis, food poisoning, c diff, viral   Co morbidities that complicate the patient evaluation  Htn and asthma   Additional history obtained:  Additional history obtained from epic chart review  Lab Tests:  I Ordered, and personally interpreted labs.  The pertinent results include:  cbc nl, preg neg, ua +protein, cmp nl, lip nl  Cardiac Monitoring:  The patient was maintained on a cardiac monitor.  I personally viewed and interpreted the cardiac monitored which showed an underlying rhythm of: sinus tachy initially; now nsr   Medicines ordered and prescription drug management:  I ordered medication including IVFs and IV zofran  for sx  Reevaluation of the patient after these medicines showed that the patient improved I have reviewed the patients home medicines and have made adjustments as needed   Test Considered:  ct   Critical Interventions:  ivfs  Problem List / ED Course:  N/v/d:  pt is feeling much better after treatment.  She is able to keep down fluids.  She is stable for d/c.  Return if worse.  F/u with pcp.   Reevaluation:  After the interventions noted above, I reevaluated the patient and found that they have :improved   Social Determinants of Health:  Lives at home   Dispostion:  After consideration of the diagnostic results  and the patients response to treatment, I feel that the patent would benefit from discharge with outpatient f/u.          Final Clinical Impression(s) / ED Diagnoses Final diagnoses:  Dehydration  Nausea vomiting and diarrhea    Rx / DC Orders ED Discharge Orders          Ordered  ondansetron (ZOFRAN-ODT) 4 MG disintegrating tablet  Every 8 hours PRN        10/09/22 0981              Jacalyn Lefevre, MD 10/09/22 [phone]

## 2022-10-09 NOTE — ED Triage Notes (Addendum)
Pt states she been vomiting since yesterday with diarrhea. Pt having some abdominal pain in the middle of stomach and can not keep anything down.

## 2022-10-10 ENCOUNTER — Telehealth: Payer: Self-pay

## 2022-10-10 NOTE — Transitions of Care (Post Inpatient/ED Visit) (Signed)
   10/10/2022  Name: Christy Goodman MRN: [id number] DOB: 1995-09-01  Today's TOC FU Call Status: Today's TOC FU Call Status:: Successful TOC FU Call Competed TOC FU Call Complete Date: 10/10/22  Transition Care Management Follow-up Telephone Call Date of Discharge: 10/09/22 [facility] Type of Discharge: Emergency Department Reason for ED Visit: Other: (Dehydration  Nausea vomiting and diarrhea) How have you been since you were released from the hospital?: Better Any questions or concerns?: No  Items Reviewed: Did you receive and understand the discharge instructions provided?: Yes Medications obtained,verified, and reconciled?: Yes (Medications Reviewed) Any new allergies since your discharge?: No Dietary orders reviewed?: NA Do you have support at home?: Yes  Medications Reviewed Today: Medications Reviewed Today     Reviewed by Leigh Aurora, CMA (Certified Medical Assistant) on 10/10/22 at 1153  Med List Status: <None>   Medication Order Taking? Sig Documenting Provider Last Dose Status Informant  Drospirenone (SLYND) 4 MG TABS [id number]  Take 1 tablet (4 mg total) by mouth daily. Adline Potter, NP  Active Self           Med Note Andrey Campanile, MACI D   Mon Oct 09, 2022  8:22 AM) Pt has not had in a month due to cost of medication.  lisinopril (ZESTRIL) 5 MG tablet [id number] No TAKE ONE TABLET (5MG  TOTAL) BY MOUTH DAILY  Patient taking differently: Take 5 mg by mouth daily.   Gilmore Laroche, FNP 10/07/2022 Active Self  ondansetron (ZOFRAN-ODT) 4 MG disintegrating tablet [id number]  Take 1 tablet (4 mg total) by mouth every 8 (eight) hours as needed. Jacalyn Lefevre, MD  Active             Home Care and Equipment/Supplies: Were Home Health Services Ordered?: NA Any new equipment or medical supplies ordered?: NA  Functional Questionnaire: Do you need assistance with bathing/showering or dressing?: No Do you need assistance with meal  preparation?: No Do you need assistance with eating?: No Do you have difficulty maintaining continence: No Do you need assistance with getting out of bed/getting out of a chair/moving?: No Do you have difficulty managing or taking your medications?: No  Follow up appointments reviewed: PCP Follow-up appointment confirmed?: Yes Date of PCP follow-up appointment?: 10/17/22 Follow-up Provider: Gilmore Laroche, FNP Specialist Hospital Follow-up appointment confirmed?: NA Do you need transportation to your follow-up appointment?: No Do you understand care options if your condition(s) worsen?: Yes-patient verbalized understanding    SIGNATURE  Agnes Lawrence, CMA (AAMA)  [facility] [phone]

## 2022-10-17 ENCOUNTER — Ambulatory Visit: Payer: Managed Care, Other (non HMO) | Admitting: Family Medicine

## 2022-10-27 ENCOUNTER — Encounter: Payer: Self-pay | Admitting: Family Medicine

## 2022-10-27 ENCOUNTER — Ambulatory Visit (INDEPENDENT_AMBULATORY_CARE_PROVIDER_SITE_OTHER): Payer: Managed Care, Other (non HMO) | Admitting: Family Medicine

## 2022-10-27 VITALS — BP 129/83 | HR 94 | Ht 63.0 in | Wt 239.1 lb

## 2022-10-27 DIAGNOSIS — E860 Dehydration: Secondary | ICD-10-CM

## 2022-10-27 NOTE — Progress Notes (Signed)
Established Patient Office Visit  Subjective:  Patient ID: Christy Goodman, female    DOB: 1996/01/09  Age: 27 y.o. MRN: [id number]  CC:  Chief Complaint  Patient presents with   Follow-up    ER follow up, seen on 10/09/22, pt reports feeling better.     HPI Christy Goodman is a 27 y.o. female with past medical history of asthma, essential hypertension, dizziness, and obesity presents for f/u of  chronic medical conditions.  Dehydration: The patient was seen in the ED on 10/09/2022 with complaints of nausea, vomiting with some pain in her abdomen.  The patient was treated with IV fluids and provided Toradol and Zofran for symptomatic relief. She was discharged home with Zofran 4 mg every 8 hours as needed.  Today patient denies abdominal pain, diarrhea, nausea, vomiting.  She reports feeling well.  No fever or chills reported.  Past Medical History:  Diagnosis Date   Allergies    Asthma    Gonorrhea 06/06/2016   Gonorrhea 06/06/2016   H/O seasonal allergies    Hypertension     History reviewed. No pertinent surgical history.  Family History  Problem Relation Age of Onset   Cancer Paternal Grandmother    Diabetes Paternal Grandmother    Diabetes Paternal Aunt     Social History   Socioeconomic History   Marital status: Single    Spouse name: Not on file   Number of children: Not on file   Years of education: Not on file   Highest education level: Not on file  Occupational History   Not on file  Tobacco Use   Smoking status: Former    Packs/day: 0.25    Years: 1.00    Additional pack years: 0.00    Total pack years: 0.25    Types: Cigarettes, Cigars   Smokeless tobacco: Never  Vaping Use   Vaping Use: Never used  Substance and Sexual Activity   Alcohol use: Yes    Comment: occ; tequila   Drug use: Yes    Types: Marijuana    Comment: 3 joints per day   Sexual activity: Not Currently    Birth control/protection: None, Pill  Other Topics Concern   Not on  file  Social History Narrative   Not on file   Social Determinants of Health   Financial Resource Strain: Low Risk  (02/03/2021)   Overall Financial Resource Strain (CARDIA)    Difficulty of Paying Living Expenses: Not hard at all  Food Insecurity: No Food Insecurity (02/03/2021)   Hunger Vital Sign    Worried About Running Out of Food in the Last Year: Never true    Ran Out of Food in the Last Year: Never true  Transportation Needs: No Transportation Needs (02/03/2021)   PRAPARE - Administrator, Civil Service (Medical): No    Lack of Transportation (Non-Medical): No  Physical Activity: Sufficiently Active (02/03/2021)   Exercise Vital Sign    Days of Exercise per Week: 5 days    Minutes of Exercise per Session: 30 min  Stress: No Stress Concern Present (02/03/2021)   [employer] of Occupational Health - Occupational Stress Questionnaire    Feeling of Stress : Not at all  Social Connections: Socially Isolated (02/03/2021)   Social Connection and Isolation Panel [NHANES]    Frequency of Communication with Friends and Family: More than three times a week    Frequency of Social Gatherings with Friends and Family: Twice a week  Attends Religious Services: Never    Active Member of Clubs or Organizations: No    Attends Banker Meetings: Never    Marital Status: Never married  Intimate Partner Violence: Not At Risk (02/03/2021)   Humiliation, Afraid, Rape, and Kick questionnaire    Fear of Current or Ex-Partner: No    Emotionally Abused: No    Physically Abused: No    Sexually Abused: No    Outpatient Medications Prior to Visit  Medication Sig Dispense Refill   lisinopril (ZESTRIL) 5 MG tablet TAKE ONE TABLET (5MG  TOTAL) BY MOUTH DAILY (Patient taking differently: Take 5 mg by mouth daily.) 30 tablet 1   Drospirenone (SLYND) 4 MG TABS Take 1 tablet (4 mg total) by mouth daily. (Patient not taking: Reported on 10/27/2022) 28 tablet 11   ondansetron (ZOFRAN-ODT)  4 MG disintegrating tablet Take 1 tablet (4 mg total) by mouth every 8 (eight) hours as needed. (Patient not taking: Reported on 10/27/2022) 20 tablet 0   No facility-administered medications prior to visit.    No Known Allergies  ROS Review of Systems  Constitutional:  Negative for chills and fever.  Eyes:  Negative for visual disturbance.  Respiratory:  Negative for chest tightness and shortness of breath.   Neurological:  Negative for dizziness and headaches.      Objective:    Physical Exam HENT:     Head: Normocephalic.     Mouth/Throat:     Lips: Pink.     Mouth: Mucous membranes are moist.     Comments: Skin turgor is was less than 2 seconds Cardiovascular:     Rate and Rhythm: Normal rate.     Heart sounds: Normal heart sounds.  Pulmonary:     Effort: Pulmonary effort is normal.     Breath sounds: Normal breath sounds.  Neurological:     Mental Status: She is alert.     BP 129/83   Pulse 94   Ht 5\' 3"  (1.6 m)   Wt 239 lb 1.9 oz (108.5 kg)   SpO2 97%   BMI 42.36 kg/m  Wt Readings from Last 3 Encounters:  10/27/22 239 lb 1.9 oz (108.5 kg)  10/09/22 240 lb (108.9 kg)  09/13/22 240 lb 1.3 oz (108.9 kg)    Lab Results  Component Value Date   TSH 1.060 06/16/2022   Lab Results  Component Value Date   WBC 9.9 10/09/2022   HGB 14.1 10/09/2022   HCT 42.3 10/09/2022   MCV 87.4 10/09/2022   PLT 334 10/09/2022   Lab Results  Component Value Date   NA 134 (L) 10/09/2022   K 3.4 (L) 10/09/2022   CO2 23 10/09/2022   GLUCOSE 118 (H) 10/09/2022   BUN 16 10/09/2022   CREATININE 1.08 (H) 10/09/2022   BILITOT 0.9 10/09/2022   ALKPHOS 57 10/09/2022   AST 25 10/09/2022   ALT 30 10/09/2022   PROT 8.0 10/09/2022   ALBUMIN 4.2 10/09/2022   CALCIUM 9.3 10/09/2022   ANIONGAP 11 10/09/2022   EGFR 86 06/16/2022   Lab Results  Component Value Date   CHOL 103 06/16/2022   Lab Results  Component Value Date   HDL 46 06/16/2022   Lab Results  Component  Value Date   LDLCALC 40 06/16/2022   Lab Results  Component Value Date   TRIG 82 06/16/2022   Lab Results  Component Value Date   CHOLHDL 2.2 06/16/2022   Lab Results  Component Value Date   HGBA1C  5.9 (H) 06/16/2022      Assessment & Plan:  Dehydration Assessment & Plan: Since discharge from the hospital the patient reports feeling well Denies symptoms of dizziness, headaches, blurred vision, weakness and lightheadedness She reports increasing her fluid consumption with water, Gatorade, and Pedialyte No reports of vomiting, and nausea Encouraged to continue to increase her fluid consumption to at least 64 ounces daily Encouraged to follow-up if symptoms of lightheadedness, dizziness occurs  Orders: -     BMP8+EGFR    Follow-up: Return in about 7 weeks (around 12/13/2022).   Gilmore Laroche, FNP

## 2022-10-27 NOTE — Patient Instructions (Addendum)
I appreciate the opportunity to provide care to you today!    Follow up:  12/13/2022  Labs: please stop by the lab today to get your blood drawn (BMP)  I recommend increasing your fluid intake to at least 64 ounces daily You can also drink sports drinks like Gatorade or Pedialyte's to restore electrolytes I recommend increasing your intake of lean meats, fruits, vegetables, and whole grain bread and cereal to help your body keep the right balance of salt and water   Please continue to a heart-healthy diet and increase your physical activities. Try to exercise for at least five days a week.      It was a pleasure to see you and I look forward to continuing to work together on your health and well-being. Please do not hesitate to call the office if you need care or have questions about your care.   Have a wonderful day and week. With Gratitude, Gilmore Laroche MSN, FNP-BC

## 2022-10-27 NOTE — Assessment & Plan Note (Addendum)
Since discharge from the hospital the patient reports feeling well Denies symptoms of dizziness, headaches, blurred vision, weakness and lightheadedness She reports increasing her fluid consumption with water, Gatorade, and Pedialyte No reports of vomiting, and nausea Encouraged to continue to increase her fluid consumption to at least 64 ounces daily Encouraged to follow-up if symptoms of lightheadedness, dizziness occurs

## 2022-10-28 LAB — BMP8+EGFR
BUN/Creatinine Ratio: 11 (ref 9–23)
BUN: 11 mg/dL (ref 6–20)
CO2: 26 mmol/L (ref 20–29)
Calcium: 9.6 mg/dL (ref 8.7–10.2)
Chloride: 102 mmol/L (ref 96–106)
Creatinine, Ser: 0.97 mg/dL (ref 0.57–1.00)
Glucose: 73 mg/dL (ref 70–99)
Potassium: 4.4 mmol/L (ref 3.5–5.2)
Sodium: 140 mmol/L (ref 134–144)
eGFR: 82 mL/min/{1.73_m2} (ref >59)

## 2022-10-28 NOTE — Progress Notes (Signed)
Kindly inform the patient that his labs are stable

## 2022-11-29 ENCOUNTER — Other Ambulatory Visit: Payer: Self-pay | Admitting: Nurse Practitioner

## 2022-11-29 DIAGNOSIS — I10 Essential (primary) hypertension: Secondary | ICD-10-CM

## 2022-12-13 ENCOUNTER — Ambulatory Visit: Payer: Self-pay | Admitting: Family Medicine

## 2023-01-04 ENCOUNTER — Ambulatory Visit: Payer: Self-pay | Admitting: Family Medicine

## 2023-03-05 ENCOUNTER — Other Ambulatory Visit: Payer: Self-pay | Admitting: Family Medicine

## 2023-03-05 DIAGNOSIS — I10 Essential (primary) hypertension: Secondary | ICD-10-CM

## 2023-03-07 ENCOUNTER — Ambulatory Visit (INDEPENDENT_AMBULATORY_CARE_PROVIDER_SITE_OTHER): Payer: Self-pay | Admitting: Family Medicine

## 2023-03-07 ENCOUNTER — Encounter: Payer: Self-pay | Admitting: Family Medicine

## 2023-03-07 VITALS — BP 121/76 | HR 77 | Ht 63.5 in | Wt 232.1 lb

## 2023-03-07 DIAGNOSIS — R7301 Impaired fasting glucose: Secondary | ICD-10-CM

## 2023-03-07 DIAGNOSIS — E7849 Other hyperlipidemia: Secondary | ICD-10-CM

## 2023-03-07 DIAGNOSIS — E559 Vitamin D deficiency, unspecified: Secondary | ICD-10-CM

## 2023-03-07 DIAGNOSIS — R059 Cough, unspecified: Secondary | ICD-10-CM | POA: Insufficient documentation

## 2023-03-07 DIAGNOSIS — R058 Other specified cough: Secondary | ICD-10-CM

## 2023-03-07 DIAGNOSIS — M25561 Pain in right knee: Secondary | ICD-10-CM

## 2023-03-07 DIAGNOSIS — I10 Essential (primary) hypertension: Secondary | ICD-10-CM

## 2023-03-07 DIAGNOSIS — E038 Other specified hypothyroidism: Secondary | ICD-10-CM

## 2023-03-07 MED ORDER — GUAIFENESIN 100 MG/5ML PO LIQD
5.0000 mL | ORAL | 0 refills | Status: DC | PRN
Start: 2023-03-07 — End: 2023-09-03

## 2023-03-07 NOTE — Assessment & Plan Note (Signed)
Encouraged ice therapy, rest, and over-the-counter analgesic as needed for pain relief
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
## 2023-03-07 NOTE — Assessment & Plan Note (Signed)
Controlled She takes lisinopril 5 mg daily Denies headaches, dizziness, blurred vision, chest pain, palpitation Low-sodium diet with increased physical activity encouraged BP Readings from Last 3 Encounters:  03/07/23 121/76  10/27/22 129/83  10/09/22 (!) 139/95

## 2023-03-07 NOTE — Patient Instructions (Addendum)
I appreciate the opportunity to provide care to you today!    Follow up:  5 months  Labs: please stop by the lab today to get your blood drawn (CBC, CMP, TSH, Lipid profile, HgA1c, Vit D)  Cough Start taking Robitussin 5ml by mouth every 4 hours as needed   Attached with your AVS, you will find valuable resources for self-education. I highly recommend dedicating some time to thoroughly examine them.   Please continue to a heart-healthy diet and increase your physical activities. Try to exercise for at least five days a week.    It was a pleasure to see you and I look forward to continuing to work together on your health and well-being. Please do not hesitate to call the office if you need care or have questions about your care.  In case of emergency, please visit the Emergency Department for urgent care, or contact our clinic at [phone] to schedule an appointment. We're here to help you!   Have a wonderful day and week. With Gratitude, Gilmore Laroche MSN, FNP-BC

## 2023-03-07 NOTE — Assessment & Plan Note (Signed)
Take medication as prescribed. Increase fluids and allow for plenty of rest. Recommend using a humidifier at bedtime during sleep to help with cough and nasal congestion. Follow-up if your symptoms do not improve

## 2023-03-08 LAB — CMP14+EGFR
ALT: 21 [IU]/L (ref 0–32)
AST: 20 [IU]/L (ref 0–40)
Albumin: 4 g/dL (ref 4.0–5.0)
Alkaline Phosphatase: 65 [IU]/L (ref 44–121)
BUN/Creatinine Ratio: 14 (ref 9–23)
BUN: 13 mg/dL (ref 6–20)
Bilirubin Total: 0.3 mg/dL (ref 0.0–1.2)
CO2: 24 mmol/L (ref 20–29)
Calcium: 9.4 mg/dL (ref 8.7–10.2)
Chloride: 102 mmol/L (ref 96–106)
Creatinine, Ser: 0.92 mg/dL (ref 0.57–1.00)
Globulin, Total: 2.4 g/dL (ref 1.5–4.5)
Glucose: 84 mg/dL (ref 70–99)
Potassium: 4.3 mmol/L (ref 3.5–5.2)
Sodium: 141 mmol/L (ref 134–144)
Total Protein: 6.4 g/dL (ref 6.0–8.5)
eGFR: 87 mL/min/{1.73_m2} (ref >59)

## 2023-03-08 LAB — HEMOGLOBIN A1C
Est. average glucose Bld gHb Est-mCnc: 114 mg/dL
Hgb A1c MFr Bld: 5.6 % (ref 4.8–5.6)

## 2023-03-08 LAB — LIPID PANEL
Chol/HDL Ratio: 2.5 {ratio} (ref 0.0–4.4)
Cholesterol, Total: 114 mg/dL (ref 100–199)
HDL: 45 mg/dL (ref >39)
LDL Chol Calc (NIH): 51 mg/dL (ref 0–99)
Triglycerides: 95 mg/dL (ref 0–149)
VLDL Cholesterol Cal: 18 mg/dL (ref 5–40)

## 2023-03-08 LAB — CBC WITH DIFFERENTIAL/PLATELET
Basophils Absolute: 0 10*3/uL (ref 0.0–0.2)
Basos: 0 %
EOS (ABSOLUTE): 0.1 10*3/uL (ref 0.0–0.4)
Eos: 1 %
Hematocrit: 38.1 % (ref 34.0–46.6)
Hemoglobin: 12.7 g/dL (ref 11.1–15.9)
Immature Grans (Abs): 0 10*3/uL (ref 0.0–0.1)
Immature Granulocytes: 0 %
Lymphocytes Absolute: 2.6 10*3/uL (ref 0.7–3.1)
Lymphs: 28 %
MCH: 30.1 pg (ref 26.6–33.0)
MCHC: 33.3 g/dL (ref 31.5–35.7)
MCV: 90 fL (ref 79–97)
Monocytes Absolute: 0.4 10*3/uL (ref 0.1–0.9)
Monocytes: 5 %
Neutrophils Absolute: 6.2 10*3/uL (ref 1.4–7.0)
Neutrophils: 66 %
Platelets: 316 10*3/uL (ref 150–450)
RBC: 4.22 x10E6/uL (ref 3.77–5.28)
RDW: 12.6 % (ref 11.7–15.4)
WBC: 9.4 10*3/uL (ref 3.4–10.8)

## 2023-03-08 LAB — TSH+FREE T4
Free T4: 1.32 ng/dL (ref 0.82–1.77)
TSH: 1.55 u[IU]/mL (ref 0.450–4.500)

## 2023-03-08 LAB — VITAMIN D 25 HYDROXY (VIT D DEFICIENCY, FRACTURES): Vit D, 25-Hydroxy: 37.7 ng/mL (ref 30.0–100.0)

## 2023-04-21 ENCOUNTER — Emergency Department (HOSPITAL_COMMUNITY)
Admission: EM | Admit: 2023-04-21 | Discharge: 2023-04-21 | Disposition: A | Discharge status: Home / Self Care | Payer: No Typology Code available for payment source | Attending: Emergency Medicine | Admitting: Emergency Medicine

## 2023-04-21 ENCOUNTER — Other Ambulatory Visit: Payer: Self-pay

## 2023-04-21 ENCOUNTER — Encounter (HOSPITAL_COMMUNITY): Payer: Self-pay | Admitting: Emergency Medicine

## 2023-04-21 DIAGNOSIS — R109 Unspecified abdominal pain: Secondary | ICD-10-CM | POA: Insufficient documentation

## 2023-04-21 DIAGNOSIS — Z79899 Other long term (current) drug therapy: Secondary | ICD-10-CM | POA: Diagnosis not present

## 2023-04-21 DIAGNOSIS — N946 Dysmenorrhea, unspecified: Secondary | ICD-10-CM | POA: Insufficient documentation

## 2023-04-21 DIAGNOSIS — R112 Nausea with vomiting, unspecified: Secondary | ICD-10-CM | POA: Diagnosis present

## 2023-04-21 LAB — COMPREHENSIVE METABOLIC PANEL
ALT: 25 U/L (ref 0–44)
AST: 19 U/L (ref 15–41)
Albumin: 4.1 g/dL (ref 3.5–5.0)
Alkaline Phosphatase: 53 U/L (ref 38–126)
Anion gap: 7 (ref 5–15)
BUN: 13 mg/dL (ref 6–20)
CO2: 26 mmol/L (ref 22–32)
Calcium: 9 mg/dL (ref 8.9–10.3)
Chloride: 105 mmol/L (ref 98–111)
Creatinine, Ser: 0.78 mg/dL (ref 0.44–1.00)
GFR, Estimated: >60 mL/min (ref >60)
Glucose, Bld: 97 mg/dL (ref 70–99)
Potassium: 4 mmol/L (ref 3.5–5.1)
Sodium: 138 mmol/L (ref 135–145)
Total Bilirubin: 0.7 mg/dL (ref <1.2)
Total Protein: 7.7 g/dL (ref 6.5–8.1)

## 2023-04-21 LAB — CBC WITH DIFFERENTIAL/PLATELET
Abs Immature Granulocytes: 0.02 10*3/uL (ref 0.00–0.07)
Basophils Absolute: 0 10*3/uL (ref 0.0–0.1)
Basophils Relative: 0 %
Eosinophils Absolute: 0 10*3/uL (ref 0.0–0.5)
Eosinophils Relative: 0 %
HCT: 38.2 % (ref 36.0–46.0)
Hemoglobin: 12.7 g/dL (ref 12.0–15.0)
Immature Granulocytes: 0 %
Lymphocytes Relative: 20 %
Lymphs Abs: 2.1 10*3/uL (ref 0.7–4.0)
MCH: 29.6 pg (ref 26.0–34.0)
MCHC: 33.2 g/dL (ref 30.0–36.0)
MCV: 89 fL (ref 80.0–100.0)
Monocytes Absolute: 0.5 10*3/uL (ref 0.1–1.0)
Monocytes Relative: 5 %
Neutro Abs: 7.9 10*3/uL — ABNORMAL HIGH (ref 1.7–7.7)
Neutrophils Relative %: 75 %
Platelets: 314 10*3/uL (ref 150–400)
RBC: 4.29 MIL/uL (ref 3.87–5.11)
RDW: 13.9 % (ref 11.5–15.5)
WBC: 10.5 10*3/uL (ref 4.0–10.5)
nRBC: 0 % (ref 0.0–0.2)

## 2023-04-21 LAB — URINALYSIS, ROUTINE W REFLEX MICROSCOPIC
Bacteria, UA: NONE SEEN
Bilirubin Urine: NEGATIVE
Glucose, UA: NEGATIVE mg/dL
Ketones, ur: NEGATIVE mg/dL
Leukocytes,Ua: NEGATIVE
Nitrite: NEGATIVE
Protein, ur: NEGATIVE mg/dL
RBC / HPF: >50 RBC/hpf (ref 0–5)
Specific Gravity, Urine: 1.017 (ref 1.005–1.030)
pH: 6 (ref 5.0–8.0)

## 2023-04-21 LAB — HCG, QUANTITATIVE, PREGNANCY: hCG, Beta Chain, Quant, S: <1 m[IU]/mL (ref <5)

## 2023-04-21 LAB — LIPASE, BLOOD: Lipase: 25 U/L (ref 11–51)

## 2023-04-21 MED ORDER — KETOROLAC TROMETHAMINE 15 MG/ML IJ SOLN
15.0000 mg | Freq: Once | INTRAMUSCULAR | Status: DC
  Filled 2023-04-21: qty 1

## 2023-04-21 MED ORDER — ONDANSETRON 4 MG PO TBDP
4.0000 mg | ORAL_TABLET | Freq: Once | ORAL | Status: AC
  Administered 2023-04-21: 4 mg via ORAL
  Filled 2023-04-21: qty 1

## 2023-04-21 MED ORDER — KETOROLAC TROMETHAMINE 15 MG/ML IJ SOLN
15.0000 mg | Freq: Once | INTRAMUSCULAR | Status: AC
  Administered 2023-04-21: 15 mg via INTRAMUSCULAR

## 2023-04-21 NOTE — ED Provider Notes (Signed)
[place] EMERGENCY DEPARTMENT AT [facility] Provider Note   CSN: [id number] Arrival date & time: 04/21/23  1324     History  Chief Complaint  Patient presents with   Abdominal Cramping   Vomiting    Christy Goodman is a 27 y.o. female.  Planing of pelvic and low back cramping starting yesterday when she started her period.  Had severe dysmenorrhea starting as a teenager that was controlled with Nexplanon and subsequently oral contraceptives which she stopped last month due to cost and not wanting to take hormonal medications due to concern for causing weight gain.  She states this feels likely when she had dysmenorrhea in the past.  She has not seen a gynecologist recently, denies vaginal discharge, denies urinary symptoms, no vomiting but did have some nausea today.  No fevers or chills.  No chest pain or shortness of breath or other associated symptoms.  Pain is midline.   Abdominal Cramping       Home Medications Prior to Admission medications   Medication Sig Start Date End Date Taking? Authorizing Provider  Drospirenone (SLYND) 4 MG TABS Take 1 tablet (4 mg total) by mouth daily. Patient not taking: Reported on 03/07/2023 09/18/22   Adline Potter, NP  guaiFENesin (ROBITUSSIN) 100 MG/5ML liquid Take 5 mLs by mouth every 4 (four) hours as needed for cough or to loosen phlegm. 03/07/23   Gilmore Laroche, FNP  lisinopril (ZESTRIL) 5 MG tablet TAKE ONE TABLET (5MG  TOTAL) BY MOUTH DAILY 03/05/23   Gilmore Laroche, FNP  ondansetron (ZOFRAN-ODT) 4 MG disintegrating tablet Take 1 tablet (4 mg total) by mouth every 8 (eight) hours as needed. 10/09/22   Jacalyn Lefevre, MD      Allergies    Patient has no known allergies.    Review of Systems   Review of Systems  Physical Exam Updated Vital Signs BP (!) 136/99   Pulse 78   Temp 99 F (37.2 C) (Oral)   Resp 20   Ht 5\' 3"  (1.6 m)   Wt 105.2 kg   LMP 04/20/2023 (Approximate)   SpO2 100%   BMI 41.10 kg/m   Physical Exam Vitals and nursing note reviewed.  Constitutional:      General: She is not in acute distress.    Appearance: She is well-developed.  HENT:     Head: Normocephalic and atraumatic.     Mouth/Throat:     Mouth: Mucous membranes are moist.  Eyes:     Extraocular Movements: Extraocular movements intact.     Conjunctiva/sclera: Conjunctivae normal.     Pupils: Pupils are equal, round, and reactive to light.  Cardiovascular:     Rate and Rhythm: Normal rate and regular rhythm.     Heart sounds: No murmur heard. Pulmonary:     Effort: Pulmonary effort is normal. No respiratory distress.     Breath sounds: Normal breath sounds.  Abdominal:     Palpations: Abdomen is soft.     Tenderness: There is no abdominal tenderness.  Musculoskeletal:        General: No swelling. Normal range of motion.     Cervical back: Neck supple.  Skin:    General: Skin is warm and dry.     Capillary Refill: Capillary refill takes less than 2 seconds.  Neurological:     General: No focal deficit present.     Mental Status: She is alert and oriented to person, place, and time.  Psychiatric:  Mood and Affect: Mood normal.     ED Results / Procedures / Treatments   Labs (all labs ordered are listed, but only abnormal results are displayed) Labs Reviewed  CBC WITH DIFFERENTIAL/PLATELET - Abnormal; Notable for the following components:      Result Value   Neutro Abs 7.9 (*)    All other components within normal limits  URINALYSIS, ROUTINE W REFLEX MICROSCOPIC - Abnormal; Notable for the following components:   Hgb urine dipstick LARGE (*)    All other components within normal limits  COMPREHENSIVE METABOLIC PANEL  HCG, QUANTITATIVE, PREGNANCY  LIPASE, BLOOD    EKG None  Radiology No results found.  Procedures Procedures    Medications Ordered in ED Medications  ketorolac (TORADOL) 15 MG/ML injection 15 mg (15 mg Intramuscular Given 04/21/23 1420)  ondansetron  (ZOFRAN-ODT) disintegrating tablet 4 mg (4 mg Oral Given 04/21/23 1424)    ED Course/ Medical Decision Making/ A&P                                 Medical Decision Making This patient presents to the ED for concern of pelvic and lower back cramping in the setting of recently starting menstrual period, this involves an extensive number of treatment options, and is a complaint that carries with it a high risk of complications and morbidity.  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   Lab Tests:  I Ordered, and personally interpreted labs.  The pertinent results include: Negative pregnancy, normal lipase, normal CMP, CBC normal, urinalysis shows greater than 50 red blood cells per high-power field, no white blood cells or bacteria, negative for leukocytes and nitrite      Problem List / ED Course / Critical interventions / Medication management  Menstrual cramping-patient has history of severe menstrual cramps that started as a teenager.  She states they were controlled when she had On and then subsequently on oral contraceptives which she recently stopped.  She states she started her period yesterday and is having severe cramps again that feel exactly like when she had them before starting birth control.  Denies heavy vaginal bleeding, dizziness, chest pain or shortness of breath but did have some nausea.  She has a benign abdominal exam, reassuring labs.  Does not have pain out of proportion to suggest ovarian torsion, pain does not lateralize.  She is not pregnant.  I do not feel she has an acute  intra-abdominal process at this time, she is not having vaginal discharge or other vaginal or pelvic problems suggest PID.  I discussed with patient symptoms likely related to to her dysmenorrhea which we will treat with NSAIDs, advised on gynecology follow-up as outpatient to discuss further management.  She is advised that taking naproxen starting 2 to 3 days before her menstrual period and for the first several days can sometimes help as well. I ordered medication including Toradol for pain Reevaluation of the patient after these medicines showed that the patient improved I have reviewed the patients home medicines and have made adjustments as needed     Amount and/or Complexity of Data Reviewed Labs: ordered.  Risk Prescription  drug management.           Final Clinical Impression(s) / ED Diagnoses Final diagnoses:  Dysmenorrhea    Rx / DC Orders ED Discharge Orders     None         Ma Rings, PA-C 04/21/23 1434    Jacalyn Lefevre, MD 04/24/23 [phone]

## 2023-04-21 NOTE — ED Triage Notes (Signed)
Started menstrual cycle, having abdominal cramps n/v

## 2023-04-21 NOTE — Discharge Instructions (Signed)
It was a pleasure taking care of you today.  You are seen in the ER for abdominal cramping in association with your menstrual period.  We are treating you with anti-inflammatories.  This should help with the pain.  Please follow-up closely with gynecology.  If you have worsening pain, fever, vomiting or any other worrisome changes you come back to the ER right away.

## 2023-04-21 NOTE — ED Notes (Signed)
Pt complains of general ABD over entire ABD that radiates to back Period started yesterday Pain started today Pt last sexual partner was months ago and denies chance of pregnancy Pt also complains of nausea   UA sent to lab Pt attached to partial monitor

## 2023-05-31 ENCOUNTER — Other Ambulatory Visit: Payer: Self-pay | Admitting: Family Medicine

## 2023-05-31 DIAGNOSIS — I10 Essential (primary) hypertension: Secondary | ICD-10-CM

## 2023-06-28 ENCOUNTER — Ambulatory Visit: Payer: Self-pay | Admitting: Family Medicine

## 2023-08-06 ENCOUNTER — Ambulatory Visit: Payer: Self-pay | Admitting: Family Medicine

## 2023-08-27 ENCOUNTER — Other Ambulatory Visit: Payer: Self-pay | Admitting: Family Medicine

## 2023-08-27 DIAGNOSIS — I10 Essential (primary) hypertension: Secondary | ICD-10-CM

## 2023-09-03 ENCOUNTER — Ambulatory Visit (INDEPENDENT_AMBULATORY_CARE_PROVIDER_SITE_OTHER)

## 2023-09-03 VITALS — BP 129/83 | HR 81 | Ht 63.0 in | Wt 230.0 lb

## 2023-09-03 DIAGNOSIS — N926 Irregular menstruation, unspecified: Secondary | ICD-10-CM

## 2023-09-03 NOTE — Progress Notes (Unsigned)
 Acute Office Visit  Subjective:     Patient ID: Christy Goodman, female    DOB: 1995-06-10, 28 y.o.   MRN: [id number]  Chief Complaint  Patient presents with   Menstrual Problem    Pt states "For a week she was spotting, and always been heavy" Pt states " She stopped 22-30th of march after her last period for a week then started back bleeding again 31-1 of April she started spotting "     HPI  Irregular Menstruation: Patient complains of irregular menses. No LMP recorded. Periods are irregular, lasting 5 days. Dysmenorrhea:moderate, occurring first 1-2 days of flow. Cyclic symptoms include bloating, fluid retention, and pelvic pain.  Current contraception: none. History of infertility: no. History of abnormal Pap smear: no   ROS      Objective:    BP 129/83 (BP Location: Right Arm, Patient Position: Sitting, Cuff Size: Large)   Pulse 81   Ht 5\' 3"  (1.6 m)   Wt 230 lb (104.3 kg)   SpO2 98%   BMI 40.74 kg/m  BP Readings from Last 3 Encounters:  09/03/23 129/83  04/21/23 (!) 136/99  03/07/23 121/76   Wt Readings from Last 3 Encounters:  09/03/23 230 lb (104.3 kg)  04/21/23 232 lb (105.2 kg)  03/07/23 232 lb 1.3 oz (105.3 kg)     Physical Exam Vitals and nursing note reviewed.  Constitutional:      Appearance: Normal appearance.  Eyes:     Extraocular Movements: Extraocular movements intact.     Pupils: Pupils are equal, round, and reactive to light.  Cardiovascular:     Rate and Rhythm: Normal rate and regular rhythm.  Pulmonary:     Effort: Pulmonary effort is normal.     Breath sounds: Normal breath sounds.  Abdominal:     General: Bowel sounds are normal.     Palpations: Abdomen is soft.  Neurological:     Mental Status: She is alert and oriented to person, place, and time.  Psychiatric:        Mood and Affect: Mood normal.        Thought Content: Thought content normal.     Results for orders placed or performed in visit on 09/03/23  CBC  Result  Value Ref Range   WBC 9.7 3.4 - 10.8 x10E3/uL   RBC 4.35 3.77 - 5.28 x10E6/uL   Hemoglobin 13.1 11.1 - 15.9 g/dL   Hematocrit 20.2 54.2 - 46.6 %   MCV 88 79 - 97 fL   MCH 30.1 26.6 - 33.0 pg   MCHC 34.1 31.5 - 35.7 g/dL   RDW 70.6 23.7 - 62.8 %   Platelets 319 150 - 450 x10E3/uL  TSH + free T4  Result Value Ref Range   TSH 1.190 0.450 - 4.500 uIU/mL   Free T4 1.16 0.82 - 1.77 ng/dL  Prolactin  Result Value Ref Range   Prolactin 13.3 4.8 - 33.4 ng/mL  BMP8+eGFR  Result Value Ref Range   Glucose 87 70 - 99 mg/dL   BUN 12 6 - 20 mg/dL   Creatinine, Ser 3.15 0.57 - 1.00 mg/dL   eGFR 94 >17 OH/YWV/3.71   BUN/Creatinine Ratio 14 9 - 23   Sodium 138 134 - 144 mmol/L   Potassium 4.5 3.5 - 5.2 mmol/L   Chloride 102 96 - 106 mmol/L   CO2 22 20 - 29 mmol/L   Calcium 9.8 8.7 - 10.2 mg/dL  hCG, serum, qualitative  Result Value Ref  Range   hCG,Beta Subunit,Qual,Serum Negative Negative <6 mIU/mL       Assessment & Plan:   Problem List Items Addressed This Visit       Other   Menstrual irregularity - Primary   Check labs today and f/u according to results.  Will have her f/u with ob/gyn for further evaluation and discuss birth control options.  She is in agreement with this plan.       Relevant Orders   CBC (Completed)   TSH + free T4 (Completed)   Prolactin (Completed)   BMP8+eGFR (Completed)   hCG, serum, qualitative (Completed)   Ambulatory referral to Obstetrics / Gynecology    No orders of the defined types were placed in this encounter.   No follow-ups on file.  Darral Dash, FNP

## 2023-09-04 DIAGNOSIS — N926 Irregular menstruation, unspecified: Secondary | ICD-10-CM | POA: Insufficient documentation

## 2023-09-04 LAB — CBC
Hematocrit: 38.4 % (ref 34.0–46.6)
Hemoglobin: 13.1 g/dL (ref 11.1–15.9)
MCH: 30.1 pg (ref 26.6–33.0)
MCHC: 34.1 g/dL (ref 31.5–35.7)
MCV: 88 fL (ref 79–97)
Platelets: 319 10*3/uL (ref 150–450)
RBC: 4.35 x10E6/uL (ref 3.77–5.28)
RDW: 13.2 % (ref 11.7–15.4)
WBC: 9.7 10*3/uL (ref 3.4–10.8)

## 2023-09-04 LAB — BMP8+EGFR
BUN/Creatinine Ratio: 14 (ref 9–23)
BUN: 12 mg/dL (ref 6–20)
CO2: 22 mmol/L (ref 20–29)
Calcium: 9.8 mg/dL (ref 8.7–10.2)
Chloride: 102 mmol/L (ref 96–106)
Creatinine, Ser: 0.86 mg/dL (ref 0.57–1.00)
Glucose: 87 mg/dL (ref 70–99)
Potassium: 4.5 mmol/L (ref 3.5–5.2)
Sodium: 138 mmol/L (ref 134–144)
eGFR: 94 mL/min/{1.73_m2} (ref >59)

## 2023-09-04 LAB — TSH+FREE T4
Free T4: 1.16 ng/dL (ref 0.82–1.77)
TSH: 1.19 u[IU]/mL (ref 0.450–4.500)

## 2023-09-04 LAB — HCG, SERUM, QUALITATIVE: hCG,Beta Subunit,Qual,Serum: NEGATIVE m[IU]/mL (ref <6)

## 2023-09-04 LAB — PROLACTIN: Prolactin: 13.3 ng/mL (ref 4.8–33.4)

## 2023-09-04 NOTE — Assessment & Plan Note (Signed)
 Check labs today and f/u according to results.  Will have her f/u with ob/gyn for further evaluation and discuss birth control options.  She is in agreement with this plan.

## 2023-09-05 ENCOUNTER — Encounter: Payer: Self-pay | Admitting: Adult Health

## 2023-09-05 ENCOUNTER — Ambulatory Visit (INDEPENDENT_AMBULATORY_CARE_PROVIDER_SITE_OTHER): Admitting: Adult Health

## 2023-09-05 ENCOUNTER — Other Ambulatory Visit (HOSPITAL_COMMUNITY)
Admission: RE | Admit: 2023-09-05 | Discharge: 2023-09-05 | Disposition: A | Discharge status: Home / Self Care | Source: Ambulatory Visit | Attending: Adult Health | Admitting: Adult Health

## 2023-09-05 VITALS — BP 128/84 | HR 91 | Ht 63.0 in | Wt 230.0 lb

## 2023-09-05 DIAGNOSIS — Z3202 Encounter for pregnancy test, result negative: Secondary | ICD-10-CM | POA: Insufficient documentation

## 2023-09-05 DIAGNOSIS — Z124 Encounter for screening for malignant neoplasm of cervix: Secondary | ICD-10-CM | POA: Insufficient documentation

## 2023-09-05 DIAGNOSIS — N921 Excessive and frequent menstruation with irregular cycle: Secondary | ICD-10-CM

## 2023-09-05 LAB — POCT URINE PREGNANCY: Preg Test, Ur: NEGATIVE

## 2023-09-05 NOTE — Progress Notes (Signed)
  Subjective:     Patient ID: Christy Goodman, female   DOB: [birth year], 28 y.o.   MRN: [id number]  HPI Christy Goodman is a 28 year old black female, single, G0P0 in complaining of having irregular bleeding, had period in March then started spotting last week and heavier now, no cramping. She had labs with PCP 09/03/23 and they were normal.  She needs a pap.  PCP is Sharen Hones FNP  Review of Systems +irregular bleeding, had period in March then started spotting last week and heavier now, no cramping.   Has had sex with female Reviewed past medical,surgical, social and family history. Reviewed medications and allergies.  Objective:   Physical Exam BP 128/84 (BP Location: Left Arm, Patient Position: Sitting)   Pulse 91   Ht 5\' 3"  (1.6 m)   Wt 230 lb (104.3 kg)   LMP 08/12/2023 (Exact Date)   BMI 40.74 kg/m  UPT negative    Skin warm and dry.Pelvic: external genitalia is normal in appearance no lesions, vagina:+period blood,urethra has no lesions or masses noted, cervix is has nabothian cyst at 5 and 7 0'clock, pap with GC/CHL and HR HPV genotyping, uterus: normal size, shape and contour, non tender, no masses felt, adnexa: no masses or tenderness noted. Bladder is non tender and no masses felt.   Upstream - 09/05/23 1423       Pregnancy Intention Screening   Does the patient want to become pregnant in the next year? No    Does the patient's partner want to become pregnant in the next year? No    Would the patient like to discuss contraceptive options today? No      Contraception Wrap Up   Current Method Abstinence    End Method Abstinence            Examination chaperoned by Malachy Mood LPN  Assessment:     1. Pregnancy examination or test, negative result  - POCT urine pregnancy  2. Routine Papanicolaou smear Pap sent Pap in 3 years if negative  - Cytology - PAP( [place])  3. Irregular intermenstrual bleeding (Primary) +irregular bleeding, had period in March then started  spotting last week and heavier now, no cramping. Will get pelvic US in 3 weeks to assess uterus and ovaries Call me Friday if still bleeding - US PELVIC COMPLETE WITH TRANSVAGINAL; Future     Plan:     Return in 3 weeks for pelvic US

## 2023-09-07 ENCOUNTER — Other Ambulatory Visit: Payer: Self-pay | Admitting: Adult Health

## 2023-09-07 MED ORDER — NORETHINDRONE ACETATE 5 MG PO TABS: 5.0000 mg | ORAL_TABLET | Freq: Every day | ORAL | 0 refills | Status: DC

## 2023-09-07 NOTE — Progress Notes (Signed)
Rx aygestin

## 2023-09-11 LAB — CYTOLOGY - PAP
Chlamydia: NEGATIVE
Comment: NEGATIVE
Comment: NEGATIVE
Comment: NORMAL
Diagnosis: NEGATIVE
High risk HPV: NEGATIVE
Neisseria Gonorrhea: NEGATIVE

## 2023-09-12 ENCOUNTER — Encounter: Payer: Self-pay | Admitting: Women's Health

## 2023-10-05 ENCOUNTER — Other Ambulatory Visit

## 2023-10-09 ENCOUNTER — Other Ambulatory Visit: Admitting: Radiology

## 2023-11-11 ENCOUNTER — Other Ambulatory Visit: Payer: Self-pay

## 2023-11-11 ENCOUNTER — Encounter (HOSPITAL_COMMUNITY): Payer: Self-pay

## 2023-11-11 ENCOUNTER — Emergency Department (HOSPITAL_COMMUNITY): Payer: Self-pay

## 2023-11-11 ENCOUNTER — Emergency Department (HOSPITAL_COMMUNITY)
Admission: EM | Admit: 2023-11-11 | Discharge: 2023-11-11 | Disposition: A | Discharge status: Home / Self Care | Payer: Self-pay | Attending: Emergency Medicine | Admitting: Emergency Medicine

## 2023-11-11 DIAGNOSIS — M25551 Pain in right hip: Secondary | ICD-10-CM | POA: Insufficient documentation

## 2023-11-11 DIAGNOSIS — X501XXA Overexertion from prolonged static or awkward postures, initial encounter: Secondary | ICD-10-CM | POA: Insufficient documentation

## 2023-11-11 DIAGNOSIS — Y92000 Kitchen of unspecified non-institutional (private) residence as  the place of occurrence of the external cause: Secondary | ICD-10-CM | POA: Insufficient documentation

## 2023-11-11 DIAGNOSIS — J45909 Unspecified asthma, uncomplicated: Secondary | ICD-10-CM | POA: Insufficient documentation

## 2023-11-11 LAB — PREGNANCY, URINE: Preg Test, Ur: NEGATIVE

## 2023-11-11 MED ORDER — KETOROLAC TROMETHAMINE 15 MG/ML IJ SOLN
15.0000 mg | Freq: Once | INTRAMUSCULAR | Status: AC
  Administered 2023-11-11: 15 mg via INTRAMUSCULAR
  Filled 2023-11-11: qty 1

## 2023-11-11 MED ORDER — ETODOLAC 400 MG PO TABS: 400.0000 mg | ORAL_TABLET | Freq: Two times a day (BID) | ORAL | 0 refills | Status: DC

## 2023-11-11 MED ORDER — METHYLPREDNISOLONE SODIUM SUCC 125 MG IJ SOLR
125.0000 mg | Freq: Once | INTRAMUSCULAR | Status: AC
  Administered 2023-11-11: 125 mg via INTRAMUSCULAR
  Filled 2023-11-11: qty 2

## 2023-11-11 NOTE — Discharge Instructions (Addendum)
 You likely have a muscle strain.  Anti-inflammatory medicine sent into your pharmacy.  Return for any concerning symptoms.  Follow-up with your primary care doctor in the next few days to ensure that symptoms are improving and not worsening or evolving.

## 2023-11-11 NOTE — ED Triage Notes (Signed)
 Pt stated the her right hip has started hurting for the last two days, especially when she is walking or moving

## 2023-11-11 NOTE — ED Provider Notes (Signed)
 [place] [facility] AT [facility] Provider Note   CSN: [id number] Arrival date & time: 11/11/23  1211     Patient presents with: Hip Pain   Christy Goodman is a 28 y.o. female.   27 year old female presents today for concern of right hip pain that has been present for the past 3 days.  Patient works in a [employer] and does a lot of bending.  She states pain is only present when she pushes over the area or moves in a certain way.  Denies any abdominal pain, nausea, dysuria, vaginal discharge.  Pain does not radiate anywhere else.  The history is provided by the patient. No language interpreter was used.       Prior to Admission medications   Medication Sig Start Date End Date Taking? Authorizing Provider  etodolac (LODINE) 400 MG tablet Take 1 tablet (400 mg total) by mouth 2 (two) times daily. 11/11/23  Yes Daimian Sudberry, PA-C  norethindrone  (AYGESTIN ) 5 MG tablet Take 1 tablet (5 mg total) by mouth daily. 09/07/23   Javan Messing, NP  lisinopril  (ZESTRIL ) 5 MG tablet TAKE ONE TABLET (5MG  TOTAL) BY MOUTH DAILY 08/27/23   Zarwolo, Gloria, FNP  Multiple Vitamins-Minerals (ONE-A-DAY WOMENS PO) Take by mouth.    [provider]  Omega-3 Fatty Acids (FISH OIL PO) Take by mouth.    [provider]    Allergies: Patient has no known allergies.    Review of Systems  Constitutional:  Negative for activity change and chills.  Gastrointestinal:  Negative for abdominal pain, nausea and vomiting.  Genitourinary:  Negative for dysuria, flank pain and vaginal discharge.  All other systems reviewed and are negative.   Updated Vital Signs BP (!) 137/95   Pulse 79   Ht 5' 3 (1.6 m)   Wt 104.8 kg   LMP 11/06/2023   SpO2 100%   BMI 40.92 kg/m   Physical Exam Vitals and nursing note reviewed.  Constitutional:      General: She is not in acute distress.    Appearance: Normal appearance. She is not ill-appearing.  HENT:     Head: Normocephalic  and atraumatic.     Nose: Nose normal.   Eyes:     Conjunctiva/sclera: Conjunctivae normal.    Cardiovascular:     Rate and Rhythm: Normal rate and regular rhythm.  Pulmonary:     Effort: Pulmonary effort is normal. No respiratory distress.  Abdominal:     General: There is no distension.     Tenderness: There is no abdominal tenderness. There is no guarding.     Comments: No tenderness to palpation over the abdomen.  Only tenderness over the right hip/groin.   Musculoskeletal:        General: No deformity. Normal range of motion.     Cervical back: Normal range of motion.     Comments: Reproducible tenderness palpation present over right groin/hip.  Range of motion is reproducing the pain.  Specially flexion of the right hip.  Neurovascularly intact in bilateral lower extremities.   Skin:    Findings: No rash.   Neurological:     Mental Status: She is alert.     (all labs ordered are listed, but only abnormal results are displayed) Labs Reviewed  PREGNANCY, URINE    EKG: None  Radiology: DG Hip Unilat W or Wo Pelvis 2-3 Views Right Result Date: 11/11/2023 CLINICAL DATA:  New pain EXAM: DG HIP (WITH OR WITHOUT PELVIS) 2-3V RIGHT  COMPARISON:  April 05, 2019. FINDINGS: No acute fracture or dislocation. Joint spaces and alignment are maintained. No area of erosion or osseous destruction. No unexpected radiopaque foreign body. Soft tissues are unremarkable. IMPRESSION: 1. No acute fracture or dislocation. If there is a persistent clinical concern for nondisplaced hip or pelvic fracture, recommend dedicated pelvic CT or MRI. Electronically Signed   By: Clancy Crimes M.D.   On: 11/11/2023 13:06     Procedures   Medications Ordered in the ED  ketorolac  (TORADOL ) 15 MG/ML injection 15 mg (has no administration in time range)  methylPREDNISolone sodium succinate (SOLU-MEDROL) 125 mg/2 mL injection 125 mg (has no administration in time range)                                     Medical Decision Making Amount and/or Complexity of Data Reviewed Labs: ordered. Radiology: ordered.  Risk Prescription drug management.   Medical Decision Making / ED Course   This patient presents to the ED for concern of right hip pain, this involves an extensive number of treatment options, and is a complaint that carries with it a high risk of complications and morbidity.  The differential diagnosis includes muscle strain, fracture, contusion, appendicitis, PID, UTI, Pyelo  MDM: 28 year old female presents today for concern of right hip pain.  Reproducible pain on exam. Abdomen is benign and without any tenderness to palpation. Symptoms are reproducible.  No pain at rest. Given x-rays negative for any acute process we did discuss further evaluation with labs and imaging however she declines at this time and feels that this is MSK related and states that my abdomen is okay. Return precautions discussed.  Discussed reevaluation by PCP in a couple days after symptomatic management. Patient voices understanding and is in agreement with plan.   Lab Tests: -I ordered, reviewed, and interpreted labs.   The pertinent results include:   Labs Reviewed  PREGNANCY, URINE      EKG  EKG Interpretation Date/Time:    Ventricular Rate:    PR Interval:    QRS Duration:    QT Interval:    QTC Calculation:   R Axis:      Text Interpretation:           Imaging Studies ordered: I ordered imaging studies including right hip x-ray I independently visualized and interpreted imaging. I agree with the radiologist interpretation   Medicines ordered and prescription drug management: Meds ordered this encounter  Medications   ketorolac  (TORADOL ) 15 MG/ML injection 15 mg   methylPREDNISolone sodium succinate (SOLU-MEDROL) 125 mg/2 mL injection 125 mg    IV methylprednisolone will be converted to either a q12h or q24h frequency with the same total daily dose (TDD).   Ordered Dose: 1 to 125 mg TDD; convert to: TDD q24h.  Ordered Dose: 126 to 250 mg TDD; convert to: TDD div q12h.  Ordered Dose: >250 mg TDD; DAW.   etodolac (LODINE) 400 MG tablet    Sig: Take 1 tablet (400 mg total) by mouth 2 (two) times daily.    Dispense:  20 tablet    Refill:  0    Supervising Provider:   Annabell Key, BRIAN [[id number]]    -I have reviewed the patients home medicines and have made adjustments as needed     Reevaluation: After the interventions noted above, I reevaluated the patient and found that they have :stayed the same  Co  morbidities that complicate the patient evaluation  Past Medical History:  Diagnosis Date   Allergies    Asthma    Gonorrhea 06/06/2016   Gonorrhea 06/06/2016   H/O seasonal allergies    Hypertension       Dispostion: Discharged in stable condition.  Return precaution discussed.  Patient voices understanding and is in agreement with plan.  Final diagnoses:  Right hip pain    ED Discharge Orders          Ordered    etodolac (LODINE) 400 MG tablet  2 times daily        11/11/23 1410               Lucina Sabal, PA-C 11/11/23 1432    Early Glisson, MD 11/12/23 [phone]

## 2023-11-26 ENCOUNTER — Other Ambulatory Visit: Payer: Self-pay | Admitting: Family Medicine

## 2023-11-26 DIAGNOSIS — I10 Essential (primary) hypertension: Secondary | ICD-10-CM

## 2023-11-27 ENCOUNTER — Ambulatory Visit: Payer: Self-pay

## 2023-11-27 NOTE — Telephone Encounter (Signed)
 FYI Only or Action Required?: FYI only for provider.  Patient was last seen in primary care on 09/03/2023 by Bevely Doffing, FNP. Called Nurse Triage reporting Leg Pain. Symptoms began several weeks ago. Interventions attempted: OTC medications: Advil  and Rest, hydration, or home remedies. Symptoms are: gradually worsening.  Triage Disposition: See PCP When Office is Open (Within 3 Days)  Patient/caregiver understands and will follow disposition?: Yes       Copied from CRM [id number]. Topic: Clinical - Red Word Triage >> Nov 27, 2023  8:26 AM Treva T wrote: [facility] that prompted transfer to Nurse Triage: Patient calling states she has right leg/knee pain, with a small knot, swelling, also on occasion knee will buckle when standing Reason for Disposition  [1] MODERATE pain (e.g., interferes with normal activities, limping) AND [2] present > 3 days  Answer Assessment - Initial Assessment Questions 1. ONSET: When did the pain start?      Started 3 weeks ago, started with a pulled muscle then evolved to knee  2. LOCATION: Where is the pain located?      Right leg and knee pain  3. PAIN: How bad is the pain?    (Scale 1-10; or mild, moderate, severe)   -  MILD (1-3): doesn't interfere with normal activities    -  MODERATE (4-7): interferes with normal activities (e.g., work or school) or awakens from sleep, limping    -  SEVERE (8-10): excruciating pain, unable to do any normal activities, unable to walk     Moderate pain, has trouble moving in certain directions  4. WORK OR EXERCISE: Has there been any recent work or exercise that involved this part of the body?      No  5. CAUSE: What do you think is causing the leg pain?     Unsure of cause  6. OTHER SYMPTOMS: Do you have any other symptoms? (e.g., chest pain, back pain, breathing difficulty, swelling, rash, fever, numbness, weakness)     Knee swelling  7. PREGNANCY: Is there any chance you are pregnant? When was  your last menstrual period?     No  Protocols used: Leg Pain-A-AH

## 2023-11-28 ENCOUNTER — Ambulatory Visit (HOSPITAL_COMMUNITY)
Admission: RE | Admit: 2023-11-28 | Discharge: 2023-11-28 | Disposition: A | Discharge status: Home / Self Care | Source: Ambulatory Visit

## 2023-11-28 ENCOUNTER — Ambulatory Visit: Payer: Self-pay

## 2023-11-28 VITALS — BP 125/86 | HR 89 | Ht 63.0 in | Wt 230.0 lb

## 2023-11-28 DIAGNOSIS — M25561 Pain in right knee: Secondary | ICD-10-CM | POA: Insufficient documentation

## 2023-11-28 DIAGNOSIS — G8929 Other chronic pain: Secondary | ICD-10-CM | POA: Insufficient documentation

## 2023-11-28 MED ORDER — MELOXICAM 7.5 MG PO TABS: 7.5000 mg | ORAL_TABLET | Freq: Every day | ORAL | 2 refills | Status: DC | PRN

## 2023-11-28 NOTE — Progress Notes (Signed)
 Established Patient Office Visit  Subjective   Patient ID: Christy Goodman, female    DOB: January 02, 1996  Age: 28 y.o. MRN: [id number]  Chief Complaint  Patient presents with   Medical Management of Chronic Issues    Knee pain for months knee buckles and pain score is a 10      HPI Pain Patient was seen at [facility], ER on 11/11/2023 for right hip pain.  She reports chronic right knee pain. was not an injury that may have caused the pain. The pain started several months ago and is gradually worsening. The pain does not radiate . The pain is described as aching and burning, is moderate in intensity, occurring every day. Symptoms are worse in the: evening  Aggravating factors: going from sitting to standing Relieving factors: standing.  She has tried application of ice, acetaminophen , and NSAIDs with mild relief.   --------------------------------------------------------------------------------------------------- Patient Active Problem List   Diagnosis Date Noted   Irregular intermenstrual bleeding 09/05/2023   Routine Papanicolaou smear 09/05/2023   Pregnancy examination or test, negative result 09/05/2023   Menstrual irregularity 09/04/2023   Cough 03/07/2023   Dehydration 10/27/2022   Right knee pain 09/14/2022   Encounter for surveillance of contraceptive pills 08/21/2022   DUB (dysfunctional uterine bleeding) 08/21/2022   Screening examination for STD (sexually transmitted disease) 08/21/2022   Oral contraception initiation 06/14/2022   Obesity 12/09/2021   Generalized abdominal cramps 12/09/2021   Abnormal uterine bleeding (AUB) 01/07/2021   Nausea & vomiting 08/31/2020   Dizziness 06/08/2020   Encounter for general adult medical examination with abnormal findings 06/08/2020   Screening due 06/08/2020   Hypertension, essential 06/08/2020   Asthma 06/08/2020   Herpes genitalis in women 04/12/2015      ROS    Objective:     BP 125/86   Pulse 89   Ht 5' 3  (1.6 m)   Wt 230 lb 0.6 oz (104.3 kg)   LMP 11/06/2023   SpO2 97%   BMI 40.75 kg/m  BP Readings from Last 3 Encounters:  11/28/23 125/86  11/11/23 (!) 137/95  09/05/23 128/84   Wt Readings from Last 3 Encounters:  11/28/23 230 lb 0.6 oz (104.3 kg)  11/11/23 231 lb (104.8 kg)  09/05/23 230 lb (104.3 kg)      Physical Exam Vitals and nursing note reviewed.  Constitutional:      Appearance: Normal appearance.  Eyes:     Extraocular Movements: Extraocular movements intact.     Pupils: Pupils are equal, round, and reactive to light.  Musculoskeletal:     Right knee: Swelling (minimal) and bony tenderness present. No crepitus. Normal range of motion. Tenderness present over the patellar tendon. Normal alignment, normal meniscus and normal patellar mobility.     Instability Tests: Anterior drawer test negative.     Left knee: Normal.  Neurological:     Mental Status: She is alert and oriented to person, place, and time.  Psychiatric:        Mood and Affect: Mood normal.        Thought Content: Thought content normal.      No results found for any visits on 11/28/23.    The ASCVD Risk score (Arnett DK, et al., 2019) failed to calculate for the following reasons:   The 2019 ASCVD risk score is only valid for ages 57 to 84    Assessment & Plan:   Problem List Items Addressed This Visit  Other   Right knee pain - Primary   Relevant Medications   meloxicam (MOBIC) 7.5 MG tablet   Other Relevant Orders   DG Knee 1-2 Views Right    No follow-ups on file.    Leita Longs, FNP

## 2023-12-23 ENCOUNTER — Ambulatory Visit: Payer: Self-pay

## 2024-02-29 ENCOUNTER — Other Ambulatory Visit: Payer: Self-pay | Admitting: Family Medicine

## 2024-02-29 DIAGNOSIS — I10 Essential (primary) hypertension: Secondary | ICD-10-CM

## 2024-02-29 NOTE — Telephone Encounter (Signed)
 Copied from CRM [id number]. Topic: Clinical - Medication Refill >> Feb 29, 2024  2:46 PM Everette C wrote: Medication: lisinopril  (ZESTRIL ) 5 MG tablet  Has the patient contacted their pharmacy? Yes (Agent: If no, request that the patient contact the pharmacy for the refill. If patient does not wish to contact the pharmacy document the reason why and proceed with request.) (Agent: If yes, when and what did the pharmacy advise?)  This is the patient's preferred pharmacy:  [facility] - [place], [place] - [address] [place] [address] Phone: [phone] Fax: [phone]  Is this the correct pharmacy for this prescription? Yes If no, delete pharmacy and type the correct one.   Has the prescription been filled recently? Yes  Is the patient out of the medication? Yes  Has the patient been seen for an appointment in the last year OR does the patient have an upcoming appointment? Yes  Can we respond through MyChart? No  Agent: Please be advised that Rx refills may take up to 3 business days. We ask that you follow-up with your pharmacy.

## 2024-03-03 MED ORDER — LISINOPRIL 5 MG PO TABS: ORAL_TABLET | ORAL | 0 refills | Status: DC

## 2024-05-04 ENCOUNTER — Emergency Department (HOSPITAL_COMMUNITY)
Admission: EM | Admit: 2024-05-04 | Discharge: 2024-05-04 | Disposition: A | Discharge status: Home / Self Care | Attending: Emergency Medicine | Admitting: Emergency Medicine

## 2024-05-04 ENCOUNTER — Emergency Department (HOSPITAL_COMMUNITY)

## 2024-05-04 ENCOUNTER — Other Ambulatory Visit: Payer: Self-pay

## 2024-05-04 ENCOUNTER — Encounter (HOSPITAL_COMMUNITY): Payer: Self-pay

## 2024-05-04 DIAGNOSIS — R1031 Right lower quadrant pain: Secondary | ICD-10-CM

## 2024-05-04 DIAGNOSIS — R112 Nausea with vomiting, unspecified: Secondary | ICD-10-CM

## 2024-05-04 LAB — COMPREHENSIVE METABOLIC PANEL WITH GFR
ALT: 15 U/L (ref 0–44)
AST: 19 U/L (ref 15–41)
Albumin: 4.4 g/dL (ref 3.5–5.0)
Alkaline Phosphatase: 55 U/L (ref 38–126)
Anion gap: 8 (ref 5–15)
BUN: 12 mg/dL (ref 6–20)
CO2: 28 mmol/L (ref 22–32)
Calcium: 8.8 mg/dL — ABNORMAL LOW (ref 8.9–10.3)
Chloride: 104 mmol/L (ref 98–111)
Creatinine, Ser: 0.87 mg/dL (ref 0.44–1.00)
GFR, Estimated: >60 mL/min (ref >60)
Glucose, Bld: 98 mg/dL (ref 70–99)
Potassium: 4.1 mmol/L (ref 3.5–5.1)
Sodium: 140 mmol/L (ref 135–145)
Total Bilirubin: 0.3 mg/dL (ref 0.0–1.2)
Total Protein: 7.1 g/dL (ref 6.5–8.1)

## 2024-05-04 LAB — URINALYSIS, ROUTINE W REFLEX MICROSCOPIC
Bacteria, UA: NONE SEEN
Bilirubin Urine: NEGATIVE
Glucose, UA: NEGATIVE mg/dL
Ketones, ur: NEGATIVE mg/dL
Leukocytes,Ua: NEGATIVE
Nitrite: NEGATIVE
Protein, ur: NEGATIVE mg/dL
Specific Gravity, Urine: 1.016 (ref 1.005–1.030)
pH: 6 (ref 5.0–8.0)

## 2024-05-04 LAB — CBC
HCT: 37.8 % (ref 36.0–46.0)
Hemoglobin: 12.6 g/dL (ref 12.0–15.0)
MCH: 29.6 pg (ref 26.0–34.0)
MCHC: 33.3 g/dL (ref 30.0–36.0)
MCV: 88.9 fL (ref 80.0–100.0)
Platelets: 322 K/uL (ref 150–400)
RBC: 4.25 MIL/uL (ref 3.87–5.11)
RDW: 13.7 % (ref 11.5–15.5)
WBC: 10.1 K/uL (ref 4.0–10.5)
nRBC: 0 % (ref 0.0–0.2)

## 2024-05-04 LAB — LIPASE, BLOOD: Lipase: 25 U/L (ref 11–51)

## 2024-05-04 LAB — POC URINE PREG, ED: Preg Test, Ur: NEGATIVE

## 2024-05-04 MED ORDER — ONDANSETRON HCL 4 MG/2ML IJ SOLN
4.0000 mg | Freq: Once | INTRAMUSCULAR | Status: AC | PRN
  Administered 2024-05-04: 4 mg via INTRAVENOUS
  Filled 2024-05-04: qty 2

## 2024-05-04 MED ORDER — SODIUM CHLORIDE 0.9 % IV BOLUS
1000.0000 mL | Freq: Once | INTRAVENOUS | Status: AC
  Administered 2024-05-04: 1000 mL via INTRAVENOUS

## 2024-05-04 MED ORDER — HYDROMORPHONE HCL 1 MG/ML IJ SOLN
0.5000 mg | Freq: Once | INTRAMUSCULAR | Status: AC
  Administered 2024-05-04: 0.5 mg via INTRAVENOUS
  Filled 2024-05-04: qty 0.5

## 2024-05-04 MED ORDER — ONDANSETRON HCL 4 MG PO TABS: 4.0000 mg | ORAL_TABLET | Freq: Four times a day (QID) | ORAL | 0 refills | Status: DC

## 2024-05-04 MED ORDER — ACETAMINOPHEN 500 MG PO TABS
1000.0000 mg | ORAL_TABLET | Freq: Once | ORAL | Status: AC
  Administered 2024-05-04: 1000 mg via ORAL
  Filled 2024-05-04: qty 2

## 2024-05-04 MED ORDER — IOHEXOL 300 MG/ML  SOLN
100.0000 mL | Freq: Once | INTRAMUSCULAR | Status: AC | PRN
  Administered 2024-05-04: 100 mL via INTRAVENOUS

## 2024-05-04 NOTE — Discharge Instructions (Addendum)
 It was a pleasure taking care of you today.  Based on your history, physical exam, labs, as well as imaging I feel you are safe for discharge.  On your scans today there was no evidence of appendicitis or ovarian torsion.  Today you improved with pain medication as well as nausea medications.  I have sent in a prescription for this nausea medication which you may pick up at the pharmacy, please take as instructed as needed for nausea.  For pain I recommend over-the-counter Tylenol , please do not exceed the max daily dose of 4000mg  of tylenol  in a day or more than 1000mg  in a single dose.  Please make your primary care provider aware of your visit and all findings today, please make them aware of the cyst on your imaging as well.  If you experience any following symptoms including not limited to fever, chills, worsening abdominal pain, urinary symptoms, excessive nausea/vomiting, abnormal vaginal discharge, or other concerning symptom please return to the emergency department or seek further medical care.  I recommend that you follow-up with your primary care provider within the next week for recheck, if symptoms worsen I recommend a follow-up within 48 hours.

## 2024-05-04 NOTE — ED Triage Notes (Signed)
 Patient come in POV for complain of right hip yesterday that progressed to abdominal pain this morning. Report still has appendix and gallbladder. Stated I made myself vomit this morning, does have nausea.

## 2024-05-04 NOTE — ED Provider Notes (Signed)
 [place] EMERGENCY DEPARTMENT AT [facility] Provider Note   CSN: [id number] Arrival date & time: 05/04/24  9078     Patient presents with: Abdominal Pain   Christy Goodman is a 28 y.o. female who presents to the emergency department with a chief complaint of right lower quadrant abdominal pain.  Patient states that she originally thought that the pain was coming from her right hip but that the pain progressed over to the right side of her abdomen last night.  Patient denies known trauma/injury.  Patient states that she did have 1 episode of vomiting where she made herself vomit because she was so nauseous.  Denies hematemesis.  Denies known sick contacts.  No fever, chills, body aches.  Patient has never had any abdominal surgeries and still has her gallbladder as well as her appendix.  Denies concern for STDs, denies urinary symptoms or abnormal vaginal discharge.  Denies recent sexual contacts.  Past medical history significant for asthma, previous gonorrhea infection, allergies, hypertension, etc.    Abdominal Pain      Prior to Admission medications   Medication Sig Start Date End Date Taking? Authorizing Provider  norethindrone  (AYGESTIN ) 5 MG tablet Take 1 tablet (5 mg total) by mouth daily. 09/07/23   Signa Delon LABOR, NP  ondansetron  (ZOFRAN ) 4 MG tablet Take 1 tablet (4 mg total) by mouth every 6 (six) hours. 05/04/24  Yes Alcie Runions F, PA-C  etodolac  (LODINE ) 400 MG tablet Take 1 tablet (400 mg total) by mouth 2 (two) times daily. 11/11/23   Hildegard, Amjad, PA-C  lisinopril  (ZESTRIL ) 5 MG tablet TAKE ONE TABLET (5MG  TOTAL) BY MOUTH DAILY 03/03/24   Bacchus, Meade PEDLAR, FNP  meloxicam  (MOBIC ) 7.5 MG tablet Take 1 tablet (7.5 mg total) by mouth daily as needed for pain. 11/28/23   Bevely Doffing, FNP  Multiple Vitamins-Minerals (ONE-A-DAY WOMENS PO) Take by mouth.    [provider]  Omega-3 Fatty Acids (FISH OIL PO) Take by mouth.    [provider]     Allergies: Patient has no known allergies.    Review of Systems  Gastrointestinal:  Positive for abdominal pain.    Updated Vital Signs BP (!) 146/92   Pulse 81   Temp 98.5 F (36.9 C) (Oral)   Resp 16   Ht 5' 3 (1.6 m)   Wt 104.8 kg   LMP 05/01/2024 (Exact Date)   SpO2 100%   BMI 40.92 kg/m   Physical Exam Vitals and nursing note reviewed.  Constitutional:      General: She is awake. She is not in acute distress.    Appearance: Normal appearance. She is well-developed. She is obese. She is not toxic-appearing or diaphoretic.     Comments: Patient visibly uncomfortable  Eyes:     General: No scleral icterus. Cardiovascular:     Rate and Rhythm: Normal rate and regular rhythm.  Pulmonary:     Effort: Pulmonary effort is normal. No respiratory distress.     Breath sounds: No wheezing, rhonchi or rales.  Abdominal:     General: Abdomen is flat.     Palpations: Abdomen is soft.     Tenderness: There is abdominal tenderness in the right lower quadrant. There is no right CVA tenderness, guarding (Patient grabbed my hand during deep palpation of the RLQ) or rebound. Negative signs include Murphy's sign.  Musculoskeletal:        General: Normal range of motion.     Right lower leg:  No edema.     Left lower leg: No edema.     Comments: No tenderness with palpation of right hip, pain was not reproducible with external rotation of the right hip, grossly normal range of motion of all 4 extremities  Skin:    General: Skin is warm.     Capillary Refill: Capillary refill takes less than 2 seconds.  Neurological:     General: No focal deficit present.     Mental Status: She is alert and oriented to person, place, and time.  Psychiatric:        Mood and Affect: Mood normal.        Behavior: Behavior normal. Behavior is cooperative.     (all labs ordered are listed, but only abnormal results are displayed) Labs Reviewed  COMPREHENSIVE METABOLIC PANEL WITH GFR - Abnormal;  Notable for the following components:      Result Value   Calcium 8.8 (*)    All other components within normal limits  URINALYSIS, ROUTINE W REFLEX MICROSCOPIC - Abnormal; Notable for the following components:   Color, Urine STRAW (*)    Hgb urine dipstick MODERATE (*)    All other components within normal limits  LIPASE, BLOOD  CBC  POC URINE PREG, ED    EKG: None  Radiology: US  PELVIC COMPLETE W TRANSVAGINAL AND TORSION R/O Result Date: 05/04/2024 CLINICAL DATA:  Right-sided pelvic mass on CT, right lower quadrant pain for 2 days EXAM: TRANSABDOMINAL AND TRANSVAGINAL ULTRASOUND OF PELVIS DOPPLER ULTRASOUND OF OVARIES TECHNIQUE: Both transabdominal and transvaginal ultrasound examinations of the pelvis were performed. Transabdominal technique was performed for global imaging of the pelvis including uterus, ovaries, adnexal regions, and pelvic cul-de-sac. It was necessary to proceed with endovaginal exam following the transabdominal exam to visualize the adnexal structures. Color and duplex Doppler ultrasound was utilized to evaluate blood flow to the ovaries. COMPARISON:  05/04/2024 FINDINGS: Uterus Measurements: 10.1 x 5.0 x 6.8 cm = volume: 179.8 mL. No fibroids or other mass visualized. Endometrium Thickness: 13 mm.  No focal abnormality visualized. Right ovary Measurements: 5.3 x 4.9 x 7.9 cm = volume:  106.4 mL. The entirety of the right ovary is replaced by a heterogeneous echogenic mass corresponding to the fat containing ovarian dermoid on recent CT. Doppler: There is normal vascularity on color doppler examination. Spectral doppler arterial and venous waveforms are normal. Left ovary Measurements: 3.8 x 2.7 x 6.2 cm = volume: 33.0 mL. Normal appearance/no adnexal mass. Doppler: There is normal vascularity on color doppler examination. Spectral doppler arterial and venous waveforms are normal. Other findings No abnormal free fluid. IMPRESSION: 1. 7.9 cm right ovarian mass compatible with  dermoid seen on preceding CT. 2. No evidence of ovarian torsion.  No acute pelvic findings. Electronically Signed   By: Ozell Daring M.D.   On: 05/04/2024 13:10   CT ABDOMEN PELVIS W CONTRAST Result Date: 05/04/2024 EXAM: CT ABDOMEN AND PELVIS WITH CONTRAST 05/04/2024 10:51:39 AM TECHNIQUE: CT of the abdomen and pelvis was performed with the administration of intravenous contrast. 100 cc Omnipaque  300 was administered. Multiplanar reformatted images are provided for review. Automated exposure control, iterative reconstruction, and/or weight-based adjustment of the mA/kV was utilized to reduce the radiation dose to as low as reasonably achievable. COMPARISON: 06/26/2027 CLINICAL HISTORY: RLQ abdominal pain FINDINGS: LOWER CHEST: No acute abnormality. LIVER: The liver is unremarkable. GALLBLADDER AND BILE DUCTS: Gallbladder is unremarkable. No biliary ductal dilatation. SPLEEN: No acute abnormality. PANCREAS: No acute abnormality. ADRENAL GLANDS: No acute  abnormality. KIDNEYS, URETERS AND BLADDER: No stones in the kidneys or ureters. No hydronephrosis. No perinephric or periureteral stranding. Urinary bladder is unremarkable. GI AND BOWEL: The appendix is not well visualized. No edema or inflammation in the region of the cecal tip to suggest appendicitis. Stomach demonstrates no acute abnormality. There is no bowel obstruction. PERITONEUM AND RETROPERITONEUM: No ascites. No free air. VASCULATURE: Aorta is normal in caliber. LYMPH NODES: No lymphadenopathy. REPRODUCTIVE ORGANS: 5.9 x 3.9 cm fat-containing right adnexal mass was 5.4 x 3.5 cm on the previous study, compatible with ovarian dermoid. No left adnexal mass. Trace fluid in the endometrial canal, likely physiologic. BONES AND SOFT TISSUES: No acute osseous abnormality. No focal soft tissue abnormality. IMPRESSION: 1. No acute findings. 2. Minimal interval enlargement of right ovarian dermoid measuring 5.9 x 3.9 cm, previously 5.4 x 3.5 cm. No left adnexal  mass. Electronically signed by: Camellia Candle MD 05/04/2024 11:15 AM EST RP Workstation: [id number]     Procedures   Medications Ordered in the ED  ondansetron  (ZOFRAN ) injection 4 mg (4 mg Intravenous Given 05/04/24 0956)  HYDROmorphone  (DILAUDID ) injection 0.5 mg (0.5 mg Intravenous Given 05/04/24 0957)  sodium chloride  0.9 % bolus 1,000 mL (0 mLs Intravenous Stopped 05/04/24 1228)  iohexol  (OMNIPAQUE ) 300 MG/ML solution 100 mL (100 mLs Intravenous Contrast Given 05/04/24 1039)  acetaminophen  (TYLENOL ) tablet 1,000 mg (1,000 mg Oral Given 05/04/24 1402)                                    Medical Decision Making Amount and/or Complexity of Data Reviewed Labs: ordered. Radiology: ordered.  Risk OTC drugs. Prescription drug management.   Patient presents to the ED for concern of abdominal pain, this involves an extensive number of treatment options, and is a complaint that carries with it a high risk of complications and morbidity.  The differential diagnosis includes appendicitis, cholecystitis, small bowel obstruction, ovarian torsion, ovarian cyst, diverticulitis, nephrolithiasis, urinary tract infection, etc.   Co morbidities that complicate the patient evaluation  asthma, previous gonorrhea infection, allergies, hypertension  Lab Tests:  I Ordered, and personally interpreted labs.  The pertinent results include: CBC unremarkable, CMP shows slightly reduced calcium, otherwise unremarkable, lipase not elevated, urinalysis not consistent with infection but does show moderate hemoglobin in the urine, pregnancy negative   Imaging Studies ordered:  I ordered imaging studies including CT abdomen pelvis with contrast and pelvic ultrasound I independently visualized and interpreted imaging which showed no acute findings, right dermoid cyst on ovary I agree with the radiologist interpretation   Medicines ordered and prescription drug management:  I ordered medication including  Zofran  and Dilaudid  for pain and nausea, fluids for nausea and vomiting Reevaluation of the patient after these medicines showed that the patient improved I have reviewed the patients home medicines and have made adjustments as needed   Test Considered:  Pelvic exam: I see no clinical indication at this time as patient currently denies vaginal discharge, urinary symptoms, and denies current sexual activity   Critical Interventions:  none   Problem List / ED Course:  28 year old female, vital signs stable, presents emergency department with a chief complaint of right-sided abdominal pain Denies systemic symptoms including fever or chills, no known sick contacts, 1 episode of self-induced vomiting due to excessive nausea On physical exam abdomen is soft and nondistended, tenderness to right lower quadrant with deep palpation and some guarding, no CVA tenderness  Due to possibility of appendicitis will symptomatically treat, obtain abdominal pain labs, and perform CT scan and reassess Lab work reassuring CT significant for most likely dermoid cyst on the right ovary Reassessment pain has improved however patient still has right lower quadrant point tenderness with palpation, due to concern for ovarian torsion will order pelvic ultrasound Pelvic ultrasound also reassuring showing no evidence of torsion At this time I feel patient is appropriate for discharge, patient's pain has remained improved, will send with instructions on outpatient pain management as well as outpatient nausea medication Return precautions given Patient discharged Unknown diagnosis for abdominal pain at this time however no sign of appendicitis or acute intra-abdominal process, pelvic ultrasound reassuring with no evidence of torsion, pregnancy test negative, patient denies sexual activity currently and denies risk for STDs   Reevaluation:  After the interventions noted above, I reevaluated the patient and found that  they have :improved   Social Determinants of Health:  none   Dispostion:  After consideration of the diagnostic results and the patients response to treatment, I feel that the patent would benefit from discharge and outpatient therapy as described, continued monitoring of symptoms.       Final diagnoses:  Right lower quadrant abdominal pain  Nausea and vomiting, unspecified vomiting type    ED Discharge Orders          Ordered    ondansetron  (ZOFRAN ) 4 MG tablet  Every 6 hours        05/04/24 1359               Janetta Terrall FALCON, PA-C 05/04/24 2025    Cleotilde Rogue, MD 05/05/24 1200

## 2024-05-05 ENCOUNTER — Encounter (HOSPITAL_COMMUNITY): Payer: Self-pay

## 2024-05-05 ENCOUNTER — Other Ambulatory Visit: Payer: Self-pay

## 2024-05-05 ENCOUNTER — Ambulatory Visit: Payer: Self-pay

## 2024-05-05 ENCOUNTER — Telehealth: Payer: Self-pay | Admitting: *Deleted

## 2024-05-05 ENCOUNTER — Emergency Department (HOSPITAL_COMMUNITY)
Admission: EM | Admit: 2024-05-05 | Discharge: 2024-05-05 | Disposition: A | Discharge status: Home / Self Care | Attending: Emergency Medicine | Admitting: Emergency Medicine

## 2024-05-05 DIAGNOSIS — R1031 Right lower quadrant pain: Secondary | ICD-10-CM

## 2024-05-05 DIAGNOSIS — D270 Benign neoplasm of right ovary: Secondary | ICD-10-CM

## 2024-05-05 MED ORDER — OXYCODONE-ACETAMINOPHEN 5-325 MG PO TABS: 1.0000 | ORAL_TABLET | Freq: Three times a day (TID) | ORAL | 0 refills | Status: AC | PRN

## 2024-05-05 MED ORDER — KETOROLAC TROMETHAMINE 60 MG/2ML IM SOLN
30.0000 mg | Freq: Once | INTRAMUSCULAR | Status: AC
  Administered 2024-05-05: 30 mg via INTRAMUSCULAR
  Filled 2024-05-05: qty 2

## 2024-05-05 MED ORDER — OXYCODONE-ACETAMINOPHEN 5-325 MG PO TABS
1.0000 | ORAL_TABLET | Freq: Once | ORAL | Status: AC
  Administered 2024-05-05: 1 via ORAL
  Filled 2024-05-05: qty 1

## 2024-05-05 NOTE — ED Provider Notes (Signed)
 [place] EMERGENCY DEPARTMENT AT [facility] Provider Note   CSN: [id number] Arrival date & time: 05/05/24  1302     Patient presents with: Abdominal Pain   Christy Goodman is a 28 y.o. female.    Abdominal Pain Patient presents for right lower quadrant abdominal pain.  Medical history includes HTN, asthma.  She was seen in the ED yesterday for the same.  She underwent laboratory workup which was unremarkable.  She underwent CT as well as pelvic ultrasound.  Results of imaging studies showed findings consistent with a ovarian dermoid cyst.  Today, patient reports ongoing pain without any worsening in severity and without any migration.  She took an over-the-counter pain reliever this morning.  She has not taken anything else for her pain.  She denies any other associated symptoms.     Prior to Admission medications   Medication Sig Start Date End Date Taking? Authorizing Provider  norethindrone  (AYGESTIN ) 5 MG tablet Take 1 tablet (5 mg total) by mouth daily. 09/07/23   Griffin, Jennifer A, NP  oxyCODONE -acetaminophen  (PERCOCET/ROXICET) 5-325 MG tablet Take 1 tablet by mouth every 8 (eight) hours as needed for up to 5 days for severe pain (pain score 7-10). 05/05/24 05/10/24 Yes Melvenia Motto, MD  etodolac  (LODINE ) 400 MG tablet Take 1 tablet (400 mg total) by mouth 2 (two) times daily. 11/11/23   Hildegard, Amjad, PA-C  lisinopril  (ZESTRIL ) 5 MG tablet TAKE ONE TABLET (5MG  TOTAL) BY MOUTH DAILY 03/03/24   Bacchus, Meade PEDLAR, FNP  meloxicam  (MOBIC ) 7.5 MG tablet Take 1 tablet (7.5 mg total) by mouth daily as needed for pain. 11/28/23   Bevely Doffing, FNP  Multiple Vitamins-Minerals (ONE-A-DAY WOMENS PO) Take by mouth.    [provider]  Omega-3 Fatty Acids (FISH OIL PO) Take by mouth.    [provider]  ondansetron  (ZOFRAN ) 4 MG tablet Take 1 tablet (4 mg total) by mouth every 6 (six) hours. 05/04/24   Hinnant, Collin F, PA-C    Allergies: Patient has no known  allergies.    Review of Systems  Gastrointestinal:  Positive for abdominal pain.  All other systems reviewed and are negative.   Updated Vital Signs BP (!) 134/94 (BP Location: Right Arm)   Pulse 79   Temp 98.4 F (36.9 C) (Oral)   Resp 16   Ht 5' 3 (1.6 m)   Wt 104.8 kg   LMP 05/01/2024 (Exact Date)   SpO2 99%   BMI 40.92 kg/m   Physical Exam Vitals and nursing note reviewed.  Constitutional:      General: She is not in acute distress.    Appearance: She is well-developed. She is not ill-appearing, toxic-appearing or diaphoretic.  HENT:     Head: Normocephalic and atraumatic.  Eyes:     Conjunctiva/sclera: Conjunctivae normal.  Cardiovascular:     Rate and Rhythm: Normal rate and regular rhythm.  Pulmonary:     Effort: Pulmonary effort is normal. No respiratory distress.  Abdominal:     Palpations: Abdomen is soft.     Tenderness: There is abdominal tenderness in the right lower quadrant. There is no guarding or rebound.  Musculoskeletal:        General: No swelling.     Cervical back: Neck supple.  Skin:    General: Skin is warm and dry.  Neurological:     General: No focal deficit present.     Mental Status: She is alert and oriented to person, place, and time.  Psychiatric:        Mood and Affect: Mood normal.        Behavior: Behavior normal.     (all labs ordered are listed, but only abnormal results are displayed) Labs Reviewed - No data to display  EKG: None  Radiology: US  PELVIC COMPLETE W TRANSVAGINAL AND TORSION R/O Result Date: 05/04/2024 CLINICAL DATA:  Right-sided pelvic mass on CT, right lower quadrant pain for 2 days EXAM: TRANSABDOMINAL AND TRANSVAGINAL ULTRASOUND OF PELVIS DOPPLER ULTRASOUND OF OVARIES TECHNIQUE: Both transabdominal and transvaginal ultrasound examinations of the pelvis were performed. Transabdominal technique was performed for global imaging of the pelvis including uterus, ovaries, adnexal regions, and pelvic cul-de-sac.  It was necessary to proceed with endovaginal exam following the transabdominal exam to visualize the adnexal structures. Color and duplex Doppler ultrasound was utilized to evaluate blood flow to the ovaries. COMPARISON:  05/04/2024 FINDINGS: Uterus Measurements: 10.1 x 5.0 x 6.8 cm = volume: 179.8 mL. No fibroids or other mass visualized. Endometrium Thickness: 13 mm.  No focal abnormality visualized. Right ovary Measurements: 5.3 x 4.9 x 7.9 cm = volume:  106.4 mL. The entirety of the right ovary is replaced by a heterogeneous echogenic mass corresponding to the fat containing ovarian dermoid on recent CT. Doppler: There is normal vascularity on color doppler examination. Spectral doppler arterial and venous waveforms are normal. Left ovary Measurements: 3.8 x 2.7 x 6.2 cm = volume: 33.0 mL. Normal appearance/no adnexal mass. Doppler: There is normal vascularity on color doppler examination. Spectral doppler arterial and venous waveforms are normal. Other findings No abnormal free fluid. IMPRESSION: 1. 7.9 cm right ovarian mass compatible with dermoid seen on preceding CT. 2. No evidence of ovarian torsion.  No acute pelvic findings. Electronically Signed   By: Ozell Daring M.D.   On: 05/04/2024 13:10   CT ABDOMEN PELVIS W CONTRAST Result Date: 05/04/2024 EXAM: CT ABDOMEN AND PELVIS WITH CONTRAST 05/04/2024 10:51:39 AM TECHNIQUE: CT of the abdomen and pelvis was performed with the administration of intravenous contrast. 100 cc Omnipaque  300 was administered. Multiplanar reformatted images are provided for review. Automated exposure control, iterative reconstruction, and/or weight-based adjustment of the mA/kV was utilized to reduce the radiation dose to as low as reasonably achievable. COMPARISON: 06/26/2027 CLINICAL HISTORY: RLQ abdominal pain FINDINGS: LOWER CHEST: No acute abnormality. LIVER: The liver is unremarkable. GALLBLADDER AND BILE DUCTS: Gallbladder is unremarkable. No biliary ductal dilatation.  SPLEEN: No acute abnormality. PANCREAS: No acute abnormality. ADRENAL GLANDS: No acute abnormality. KIDNEYS, URETERS AND BLADDER: No stones in the kidneys or ureters. No hydronephrosis. No perinephric or periureteral stranding. Urinary bladder is unremarkable. GI AND BOWEL: The appendix is not well visualized. No edema or inflammation in the region of the cecal tip to suggest appendicitis. Stomach demonstrates no acute abnormality. There is no bowel obstruction. PERITONEUM AND RETROPERITONEUM: No ascites. No free air. VASCULATURE: Aorta is normal in caliber. LYMPH NODES: No lymphadenopathy. REPRODUCTIVE ORGANS: 5.9 x 3.9 cm fat-containing right adnexal mass was 5.4 x 3.5 cm on the previous study, compatible with ovarian dermoid. No left adnexal mass. Trace fluid in the endometrial canal, likely physiologic. BONES AND SOFT TISSUES: No acute osseous abnormality. No focal soft tissue abnormality. IMPRESSION: 1. No acute findings. 2. Minimal interval enlargement of right ovarian dermoid measuring 5.9 x 3.9 cm, previously 5.4 x 3.5 cm. No left adnexal mass. Electronically signed by: Camellia Candle MD 05/04/2024 11:15 AM EST RP Workstation: [id number]     Procedures   Medications Ordered in the  ED  ketorolac  (TORADOL ) injection 30 mg (has no administration in time range)  oxyCODONE -acetaminophen  (PERCOCET/ROXICET) 5-325 MG per tablet 1 tablet (has no administration in time range)                                    Medical Decision Making  Patient presenting for ongoing right lower quadrant pain.  She was seen in the ED yesterday for the same.  Imaging studies revealed a ovarian dermoid cyst without any other acute findings.  She has not had any worsening or migration of pain.  She is well-appearing on exam.  She describes single dose of over-the-counter pain reliever this morning.  Given absence of any evolution of symptoms since yesterday, I do not feel that repeat imaging is indicated.  Patient was given  multimodal pain control in the ED.  She was advised to take scheduled ibuprofen  every 6 hours.  Will prescribe short course of narcotic pain medication as well.  I spoke with the patient about symptomatic dermoid ovarian cysts and likely need for surgical removal in the future.  Patient to follow-up with her OB/GYN doctor.  She is stable for discharge at this time.     Final diagnoses:  RLQ abdominal pain  Dermoid cyst of right ovary    ED Discharge Orders          Ordered    oxyCODONE -acetaminophen  (PERCOCET/ROXICET) 5-325 MG tablet  Every 8 hours PRN        05/05/24 1411               Melvenia Motto, MD 05/05/24 1412

## 2024-05-05 NOTE — ED Triage Notes (Signed)
 Pt arrived via POV c/o RLQ abdominal pain and reports being seen here yesterday for the same. Pt reports she was advised to return for evaluation if pain persisted. Pt reports yesterday discovering she had an ovarian mass on her right ovary.

## 2024-05-05 NOTE — Telephone Encounter (Signed)
 EDFU Please advise

## 2024-05-05 NOTE — Discharge Instructions (Addendum)
 Your imaging from yesterday showed the following:  7.9 cm right ovarian mass compatible with dermoid This is likely the cause of your symptoms.  Definitive treatment of this is surgical removal.  This would have to be done by an OB/GYN doctor.  Call the telephone number below to schedule a follow-up appointment with OB/GYN.  In the meantime, treat pain with 600 mg of ibuprofen  every 6 hours.  A prescription for narcotic pain medication was sent to your pharmacy.  Take this only as needed.  Return to the emergency department if you have any new or worsening symptoms of concern.

## 2024-05-05 NOTE — Telephone Encounter (Signed)
 FYI Only or Action Required?: FYI only for provider: ED advised. Pt refused.   Patient was last seen in primary care on 11/28/2023 by Bevely Doffing, FNP.  Called Nurse Triage reporting Abdominal Pain.  Symptoms began several days ago.  Interventions attempted: OTC medications: Acetaminophen  and Other: Went to ER yesterday 05/04/24.  Symptoms are: unchanged.  Triage Disposition: Go to ED Now (Notify PCP)  Patient/caregiver understands and will follow disposition?: No, wishes to speak with PCP  Reason for Disposition  [1] SEVERE pain (e.g., excruciating) AND [2] present > 1 hour  Answer Assessment - Initial Assessment Questions Patient called in stating she started to feel sudden lower abdominal pain on the right side starting Friday night. She went to the ER yesterday for this pain and they told her she has a dermoid cyst and recommended surgery. They advised patient to follow up with PCP to discuss surgery. Patient states that they gave her some pain medicine that helped, but it was too strong and then she started to feel the pain again while in the ED. She states they then gave her Tylenol  before going home and advised her to take this at home which is not helping the pain. She complains of 10/10 pain during triage call and states that she can't walk because of the pain. Advised to go to ED for reevaluation, but patient refuses stating she does not want to go back there if they're just going to do the same thing and send me home. Patient is asking about speaking with PCP and states she wants to set up surgery because she wants this taken care of now.   1. LOCATION: Where does it hurt?      Right lower abdomen  2. RADIATION: Does the pain shoot anywhere else? (e.g., chest, back)     No  3. ONSET: When did the pain begin? (e.g., minutes, hours or days ago)      Friday night  4. SUDDEN: Gradual or sudden onset?     Sudden  5. PATTERN Does the pain come and go, or is it  constant?     Constant  6. SEVERITY: How bad is the pain?  (e.g., Scale 1-10; mild, moderate, or severe)     10/10, Tylenol  not helping  7. RECURRENT SYMPTOM: Have you ever had this type of stomach pain before? If Yes, ask: When was the last time? and What happened that time?      No  8. CAUSE: What do you think is causing the stomach pain? (e.g., gallstones, recent abdominal surgery)     States she went to the ED yesterday and they said she has a cyst  9. RELIEVING/AGGRAVATING FACTORS: What makes it better or worse? (e.g., antacids, bending or twisting motion, bowel movement)     Walking makes it worse; Nothing relieving the pain  10. OTHER SYMPTOMS: Do you have any other symptoms? (e.g., back pain, diarrhea, fever, urination pain, vomiting)       I can't eat because of my stomach; Feels like she can't have a BM  11. PREGNANCY: Is there any chance you are pregnant? When was your last menstrual period?       No  Protocols used: Abdominal Pain - [facility]  Copied from CRM [id number]. Topic: Clinical - Red Word Triage >> May 05, 2024  9:38 AM Victoria B wrote: [facility] that prompted transfer to Nurse Triage: Patient has severe pain in her stomach

## 2024-05-06 ENCOUNTER — Encounter: Payer: Self-pay | Admitting: Obstetrics & Gynecology

## 2024-05-06 ENCOUNTER — Ambulatory Visit (INDEPENDENT_AMBULATORY_CARE_PROVIDER_SITE_OTHER): Admitting: Obstetrics & Gynecology

## 2024-05-06 VITALS — BP 123/86 | HR 99 | Ht 63.0 in | Wt 213.0 lb

## 2024-05-06 DIAGNOSIS — N83291 Other ovarian cyst, right side: Secondary | ICD-10-CM

## 2024-05-06 DIAGNOSIS — R1031 Right lower quadrant pain: Secondary | ICD-10-CM

## 2024-05-06 DIAGNOSIS — D270 Benign neoplasm of right ovary: Secondary | ICD-10-CM

## 2024-05-06 NOTE — Progress Notes (Signed)
 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  Narrative & Impression  CLINICAL DATA:  Right-sided pelvic mass on CT, right lower quadrant pain for 2 days   EXAM: TRANSABDOMINAL AND TRANSVAGINAL ULTRASOUND OF PELVIS   DOPPLER ULTRASOUND OF OVARIES   TECHNIQUE: Both transabdominal and transvaginal ultrasound examinations of the pelvis were performed. Transabdominal technique was performed for global imaging of the pelvis including uterus, ovaries, adnexal regions, and pelvic cul-de-sac.   It was necessary to proceed with endovaginal exam following the transabdominal exam to visualize the adnexal structures. Color and duplex Doppler ultrasound was utilized to evaluate blood flow to the ovaries.   COMPARISON:  05/04/2024   FINDINGS: Uterus   Measurements: 10.1 x 5.0 x 6.8 cm = volume: 179.8 mL. No fibroids or other mass visualized.   Endometrium   Thickness: 13 mm.  No focal abnormality visualized.   Right ovary   Measurements: 5.3 x 4.9 x 7.9 cm = volume:  106.4 mL. The entirety of the right ovary is replaced by a heterogeneous echogenic mass corresponding to the fat containing ovarian dermoid on recent CT. Doppler: There is normal vascularity on color doppler examination. Spectral doppler arterial and venous waveforms are normal.   Left ovary   Measurements: 3.8 x 2.7 x 6.2 cm = volume: 33.0 mL. Normal appearance/no adnexal mass. Doppler: There is normal vascularity on color doppler examination. Spectral doppler arterial and venous waveforms are normal.   Other findings   No abnormal free fluid.   IMPRESSION: 1. 7.9 cm right ovarian mass compatible with dermoid seen on preceding CT. 2. No evidence of ovarian torsion.  No acute pelvic findings.      Electronically Signed   By: Ozell Daring M.D.   On: 05/04/2024 13:10   EXAM: CT ABDOMEN AND PELVIS WITH CONTRAST 05/04/2024 10:51:39 AM   TECHNIQUE: CT of the abdomen and pelvis was performed with the administration of intravenous contrast. 100 cc Omnipaque  300 was administered. Multiplanar reformatted images are provided for review. Automated exposure control, iterative reconstruction, and/or weight-based adjustment of the mA/kV was utilized to reduce the radiation dose to as low as reasonably achievable.   COMPARISON: 06/26/2027   CLINICAL HISTORY: RLQ abdominal pain   FINDINGS:   LOWER CHEST: No acute abnormality.   LIVER: The liver is unremarkable.   GALLBLADDER AND BILE DUCTS: Gallbladder is unremarkable. No biliary ductal dilatation.   SPLEEN: No acute abnormality.   PANCREAS: No acute abnormality.   ADRENAL GLANDS: No acute abnormality.   KIDNEYS, URETERS AND BLADDER: No stones in the kidneys or ureters. No hydronephrosis. No perinephric or periureteral stranding. Urinary bladder is unremarkable.   GI AND BOWEL: The appendix is not well visualized. No edema or inflammation in the region of the cecal tip to suggest appendicitis. Stomach demonstrates no acute abnormality. There is no bowel obstruction.   PERITONEUM AND RETROPERITONEUM: No ascites. No free air.   VASCULATURE: Aorta is normal in caliber.   LYMPH NODES: No lymphadenopathy.   REPRODUCTIVE ORGANS: 5.9 x 3.9 cm fat-containing right adnexal mass was 5.4 x 3.5 cm on the previous study, compatible with ovarian dermoid. No left adnexal mass. Trace fluid in the endometrial canal, likely physiologic. BONES AND SOFT TISSUES: No acute osseous abnormality. No focal soft tissue abnormality.  IMPRESSION: 1. No acute findings. 2. Minimal interval enlargement of right ovarian dermoid measuring 5.9 x 3.9 cm, previously 5.4 x 3.5 cm. No left adnexal mass.   Electronically signed by: Camellia Candle MD 05/04/2024 11:15 AM EST RP Workstation: [id number] MEDS ordered this encounter: No orders of the defined types were placed in this encounter.   Orders for this encounter: Orders Placed This Encounter  Procedures   Ambulatory Referral For Surgery Scheduling  RA right ovarian cystectomy 05/14/24-->Cynthia preliminarly OK'd  Impression + Management Plan   ICD-10-CM   1. Cyst, ovary, dermoid, right  D27.0 Ambulatory Referral For Surgery Scheduling    2. Abdominal pain, RLQ  R10.31       Follow Up: Return in about 4 weeks (around 06/03/2024) for Post Op, with Dr Jayne.     All questions were answered.  Past Medical History:  Diagnosis Date   Allergies    Asthma    Gonorrhea 06/06/2016   Gonorrhea 06/06/2016   H/O seasonal allergies    Hypertension     History reviewed. No pertinent surgical history.  OB History     Gravida  0   Para  0   Term  0   Preterm  0   AB  0   Living  0      SAB  0   IAB  0   Ectopic  0   Multiple  0   Live Births  0           No Known Allergies  Social History   Socioeconomic History   Marital status: Single    Spouse name: Not on file   Number of children: Not on file   Years of education: Not on file   Highest education level: GED or equivalent  Occupational History   Not on file  Tobacco Use   Smoking status: Former    Current packs/day: 0.25    Average packs/day: 0.3 packs/day for 1 year (0.3 ttl pk-yrs)    Types: Cigarettes, Cigars    Passive exposure: Past   Smokeless tobacco: Never  Vaping Use   Vaping status: Never Used  Substance and Sexual Activity   Alcohol use: Yes    Comment: occ; tequila   Drug use: Yes    Types: Marijuana    Comment: 3 joints per day   Sexual activity: Not Currently    Birth control/protection: None, Abstinence  Other Topics Concern   Not on file  Social History Narrative   Not on file   Social Drivers of Health   Financial Resource Strain: Low Risk   (12/09/2022)   Overall Financial Resource Strain (CARDIA)    Difficulty of Paying Living Expenses: Not hard at all  Food Insecurity: No Food Insecurity (12/09/2022)   Hunger Vital Sign    Worried About Running Out of Food in the Last Year: Never true    Ran Out of Food in the Last Year: Never true  Transportation Needs: No Transportation Needs (12/09/2022)   PRAPARE - Administrator, Civil Service (Medical): No    Lack of Transportation (Non-Medical): No  Physical Activity: Unknown (12/09/2022)   Exercise Vital Sign    Days of Exercise per Week: 0 days    Minutes of Exercise per Session: Not on file  Stress: No Stress Concern Present (12/09/2022)   [employer] of Occupational Health - Occupational Stress Questionnaire    Feeling of Stress : Not at all  Social Connections: Unknown (  12/09/2022)   Social Connection and Isolation Panel    Frequency of Communication with Friends and Family: More than three times a week    Frequency of Social Gatherings with Friends and Family: Once a week    Attends Religious Services: Patient declined    Database Administrator or Organizations: No    Attends Engineer, Structural: Not on file    Marital Status: Never married    Family History  Problem Relation Age of Onset   Cancer Paternal Grandmother    Diabetes Paternal Grandmother    Diabetes Paternal Aunt

## 2024-05-09 NOTE — Patient Instructions (Signed)
 Christy Goodman  05/09/2024     @PREFPERIOPPHARMACY @   Your procedure is scheduled on 05/14/2024.   Report to [facility] at  1030  A.M.   Call this number if you have problems the morning of surgery:  [phone]  If you experience any cold or flu symptoms such as cough, fever, chills, shortness of breath, etc. between now and your scheduled surgery, please notify us  at the above number.   Remember:  Do not eat after midnight.    You may drink clear liquids until 0830 am on 05/14/2024.        Clear liquids allowed are:                    Water, Carbonated beverages (diabetics please choose diet or no sugar options), Black Coffee Only (No creamer, milk or cream, including half & half and powdered creamer), and Clear Sports drink (No red color; diabetics please choose diet or no sugar options)             At 0830 am on 05/14/2024 drink your carb drink. You can have nothing else to drink after this.   Take these medicines the morning of surgery with A SIP OF WATER                                         oxycodone (if needed).    Do not wear jewelry, make-up or nail polish, including gel polish,  artificial nails, or any other type of covering on natural nails (fingers and  toes).  Do not wear lotions, powders, or perfumes, or deodorant.  Do not shave 48 hours prior to surgery.  Men may shave face and neck.  Do not bring valuables to the hospital.  [facility] is not responsible for any belongings or valuables.  Contacts, dentures or bridgework may not be worn into surgery.  Leave your suitcase in the car.  After surgery it may be brought to your room.  For patients admitted to the hospital, discharge time will be determined by your treatment team.  Patients discharged the day of surgery will not be allowed to drive home and must have someone with them for 24 hours.    Special instructions:   DO NOT smoke tobacco or vape for 24 hours before your  procedure.  Please read over the following fact sheets that you were given. Coughing and Deep Breathing, Surgical Site Infection Prevention, Anesthesia Post-op Instructions, and Care and Recovery After Surgery         Removing a Pocket of Fluid From an Ovary (Ovarian Cystectomy): What to Know After After this surgery, it's common to have pain and soreness in your belly, most often near the surgery site. You may also: Have a sore throat from the breathing tube. Feel tired for a few days. Your energy level will return to normal over the next few weeks. Follow these instructions at home: Medicines Take your medicines only as told. If you were given antibiotics, take them as told. Do not stop taking them even if you start to feel better. Do not take aspirin or ibuprofen  unless you're told to. You may need to take steps to help treat or prevent trouble pooping (constipation), such as: Taking medicines to help you poop. Eating foods high in fiber, like beans, whole grains, and fresh fruits and vegetables. Drinking  more fluids as told. Ask your health care provider if it's safe to drive or use machines while taking your medicine. Caring for your cut from surgery  Take care of your cut from surgery as told. Make sure you: Wash your hands with soap and water for at least 20 seconds before and after you change your bandage. If you can't use soap and water, use hand sanitizer. Change your bandage. Leave stitches or skin glue alone. Leave tape strips alone unless you're told to take them off. You may trim the edges of the tape strips if they curl up. Check the area around your cut every day for signs of infection. Check for: More redness, swelling, or pain. More fluid or blood. Warmth. Pus or a bad smell. Do not take baths, swim, or use a hot tub until you're told it's OK. Ask if you can shower. Activity Rest as told. Take short walks at least every 2 hours during the day. This helps you  breathe better and keeps your blood flowing. Ask for help if you feel weak or unsteady. Do not lift anything heavier than 10 lb (4.5 kg) until you're told it's OK. Ask what things are safe for you to do at home. Ask when you can go back to work or school. General instructions Do not douche, use tampons, or have sex until your provider says it's OK. Do not smoke, vape, or use nicotine or tobacco. Doing this can slow down healing. Plan to have a responsible adult: Drive you home from the hospital or clinic. You won't be allowed to drive. Stay with you for the time you're told. Keep all follow-up visits. Your provider will check your healing and talk with you about your test results. Contact a health care provider if: You have a fever or chills. Your cuts from surgery open up. You have any signs of infection. You feel like you may throw up or throw up. You have pain when you pee or have blood in your urine. You have a rash. You have pain that's worse or not controlled with medicine. You have more swelling or bloating in your belly. If you can't reach your provider, go to an urgent care or emergency room. Get help right away if: You have chest pain. You feel dizzy or faint. You have shortness of breath or trouble breathing. You have trouble speaking or seeing. You have pain, swelling, or redness in one arm or leg. These symptoms may be an emergency. Have someone take you to an urgent care or emergency room, or call 911 right away. Do not wait to see if the symptoms will go away. Do not drive yourself to the hospital. This information is not intended to replace advice given to you by your health care provider. Make sure you discuss any questions you have with your health care provider. Document Revised: 12/06/2022 Document Reviewed: 12/06/2022 Elsevier Patient Education  2024 Else[employer] Inc.General Anesthesia, Adult, Care After The following information offers guidance on how to care for  yourself after your procedure. Your health care provider may also give you more specific instructions. If you have problems or questions, contact your health care provider. What can I expect after the procedure? After the procedure, it is common for people to: Have pain or discomfort at the IV site. Have nausea or vomiting. Have a sore throat or hoarseness. Have trouble concentrating. Feel cold or chills. Feel weak, sleepy, or tired (fatigue). Have soreness and body aches. These can affect parts  of the body that were not involved in surgery. Follow these instructions at home: For the time period you were told by your health care provider:  Rest. Do not participate in activities where you could fall or become injured. Do not drive or use machinery. Do not drink alcohol. Do not take sleeping pills or medicines that cause drowsiness. Do not make important decisions or sign legal documents. Do not take care of children on your own. General instructions Drink enough fluid to keep your urine pale yellow. If you have sleep apnea, surgery and certain medicines can increase your risk for breathing problems. Follow instructions from your health care provider about wearing your sleep device: Anytime you are sleeping, including during daytime naps. While taking prescription pain medicines, sleeping medicines, or medicines that make you drowsy. Return to your normal activities as told by your health care provider. Ask your health care provider what activities are safe for you. Take over-the-counter and prescription medicines only as told by your health care provider. Do not use any products that contain nicotine or tobacco. These products include cigarettes, chewing tobacco, and vaping devices, such as e-cigarettes. These can delay incision healing after surgery. If you need help quitting, ask your health care provider. Contact a health care provider if: You have nausea or vomiting that does not get  better with medicine. You vomit every time you eat or drink. You have pain that does not get better with medicine. You cannot urinate or have bloody urine. You develop a skin rash. You have a fever. Get help right away if: You have trouble breathing. You have chest pain. You vomit blood. These symptoms may be an emergency. Get help right away. Call 911. Do not wait to see if the symptoms will go away. Do not drive yourself to the hospital. Summary After the procedure, it is common to have a sore throat, hoarseness, nausea, vomiting, or to feel weak, sleepy, or fatigue. For the time period you were told by your health care provider, do not drive or use machinery. Get help right away if you have difficulty breathing, have chest pain, or vomit blood. These symptoms may be an emergency. This information is not intended to replace advice given to you by your health care provider. Make sure you discuss any questions you have with your health care provider. Document Revised: 08/12/2021 Document Reviewed: 08/12/2021 Elsevier Patient Education  2024 Elsevier Inc.How to Use Chlorhexidine at Home in the Shower Chlorhexidine gluconate (CHG) is a germ-killing (antiseptic) wash that's used to clean the skin. It can get rid of the germs that normally live on the skin and can keep them away for about 24 hours. If you're having surgery, you may be told to shower with CHG at home the night before surgery. This can help lower your risk for infection. To use CHG wash in the shower, follow the steps below. Supplies needed: CHG body wash. Clean washcloth. Clean towel. How to use CHG in the shower Follow these steps unless you're told to use CHG in a different way: Start the shower. Use your normal soap and shampoo to wash your face and hair. Turn off the shower or move out of the shower stream. Pour CHG onto a clean washcloth. Do not use any type of brush or rough sponge. Start at your neck, washing your  body down to your toes. Make sure you: Wash the part of your body where the surgery will be done for at least 1 minute. Do  not scrub. Do not use CHG on your head or face unless your health care provider tells you to. If it gets into your ears or eyes, rinse them well with water. Do not wash your genitals with CHG. Wash your back and under your arms. Make sure to wash skin folds. Let the CHG sit on your skin for 1-2 minutes or as long as told. Rinse your entire body in the shower, including all body creases and folds. Turn off the shower. Dry off with a clean towel. Do not put anything on your skin afterward, such as powder, lotion, or perfume. Put on clean clothes or pajamas. If it's the night before surgery, sleep in clean sheets. General tips Use CHG only as told, and follow the instructions on the label. Use the full amount of CHG as told. This is often one bottle. Do not smoke and stay away from flames after using CHG. Your skin may feel sticky after using CHG. This is normal. The sticky feeling will go away as the CHG dries. Do not use CHG: If you have a chlorhexidine allergy or have reacted to chlorhexidine in the past. On open wounds or areas of skin that have broken skin, cuts, or scrapes. On babies younger than 16 months of age. Contact a health care provider if: You have questions about using CHG. Your skin gets irritated or itchy. You have a rash after using CHG. You swallow any CHG. Call your local poison control center [phone] in the [place] Your eyes itch badly, or they become very red or swollen. Your hearing changes. You have trouble seeing. If you can't reach your provider, go to an urgent care or emergency room. Do not drive yourself. Get help right away if: You have swelling or tingling in your mouth or throat. You make high-pitched whistling sounds when you breathe, most often when you breathe out (wheeze). You have trouble breathing. These symptoms may be an  emergency. Call 911 right away. Do not wait to see if the symptoms will go away. Do not drive yourself to the hospital. This information is not intended to replace advice given to you by your health care provider. Make sure you discuss any questions you have with your health care provider. Document Revised: 11/28/2022 Document Reviewed: 11/24/2021 Elsevier Patient Education  2024 [employer].

## 2024-05-11 ENCOUNTER — Other Ambulatory Visit: Payer: Self-pay | Admitting: Obstetrics & Gynecology

## 2024-05-11 DIAGNOSIS — Z01818 Encounter for other preprocedural examination: Secondary | ICD-10-CM

## 2024-05-12 ENCOUNTER — Inpatient Hospital Stay (HOSPITAL_COMMUNITY): Admission: RE | Admit: 2024-05-12 | Discharge: 2024-05-12 | Attending: Obstetrics & Gynecology

## 2024-05-12 ENCOUNTER — Ambulatory Visit: Admitting: Internal Medicine

## 2024-05-12 DIAGNOSIS — Z01818 Encounter for other preprocedural examination: Secondary | ICD-10-CM

## 2024-05-12 DIAGNOSIS — Z01812 Encounter for preprocedural laboratory examination: Secondary | ICD-10-CM | POA: Insufficient documentation

## 2024-05-12 DIAGNOSIS — D270 Benign neoplasm of right ovary: Secondary | ICD-10-CM | POA: Diagnosis present

## 2024-05-12 DIAGNOSIS — R1031 Right lower quadrant pain: Secondary | ICD-10-CM | POA: Diagnosis present

## 2024-05-12 LAB — CBC
HCT: 37.1 % (ref 36.0–46.0)
Hemoglobin: 12.2 g/dL (ref 12.0–15.0)
MCH: 30 pg (ref 26.0–34.0)
MCHC: 32.9 g/dL (ref 30.0–36.0)
MCV: 91.2 fL (ref 80.0–100.0)
Platelets: 294 K/uL (ref 150–400)
RBC: 4.07 MIL/uL (ref 3.87–5.11)
RDW: 13.9 % (ref 11.5–15.5)
WBC: 9.6 K/uL (ref 4.0–10.5)
nRBC: 0 % (ref 0.0–0.2)

## 2024-05-12 LAB — COMPREHENSIVE METABOLIC PANEL WITH GFR
ALT: 14 U/L (ref 0–44)
AST: 19 U/L (ref 15–41)
Albumin: 4.4 g/dL (ref 3.5–5.0)
Alkaline Phosphatase: 55 U/L (ref 38–126)
Anion gap: 11 (ref 5–15)
BUN: 9 mg/dL (ref 6–20)
CO2: 23 mmol/L (ref 22–32)
Calcium: 9.3 mg/dL (ref 8.9–10.3)
Chloride: 103 mmol/L (ref 98–111)
Creatinine, Ser: 0.76 mg/dL (ref 0.44–1.00)
GFR, Estimated: >60 mL/min (ref >60)
Glucose, Bld: 90 mg/dL (ref 70–99)
Potassium: 4.2 mmol/L (ref 3.5–5.1)
Sodium: 138 mmol/L (ref 135–145)
Total Bilirubin: 0.3 mg/dL (ref 0.0–1.2)
Total Protein: 7.2 g/dL (ref 6.5–8.1)

## 2024-05-12 LAB — URINALYSIS, ROUTINE W REFLEX MICROSCOPIC
Bacteria, UA: NONE SEEN
Bilirubin Urine: NEGATIVE
Glucose, UA: NEGATIVE mg/dL
Ketones, ur: NEGATIVE mg/dL
Leukocytes,Ua: NEGATIVE
Nitrite: NEGATIVE
Protein, ur: NEGATIVE mg/dL
Specific Gravity, Urine: 1.015 (ref 1.005–1.030)
pH: 7 (ref 5.0–8.0)

## 2024-05-12 LAB — TYPE AND SCREEN
ABO/RH(D): O POS
Antibody Screen: NEGATIVE

## 2024-05-12 LAB — RAPID HIV SCREEN (HIV 1/2 AB+AG)
HIV 1/2 Antibodies: NONREACTIVE
HIV-1 P24 Antigen - HIV24: NONREACTIVE

## 2024-05-12 LAB — PREGNANCY, URINE: Preg Test, Ur: NEGATIVE

## 2024-05-14 ENCOUNTER — Encounter: Admission: RE | Disposition: A | Payer: Self-pay | Attending: Obstetrics & Gynecology

## 2024-05-14 ENCOUNTER — Ambulatory Visit (HOSPITAL_COMMUNITY)
Admission: RE | Admit: 2024-05-14 | Discharge: 2024-05-14 | Disposition: A | Source: Home / Self Care | Attending: Obstetrics & Gynecology | Admitting: Obstetrics & Gynecology

## 2024-05-14 ENCOUNTER — Other Ambulatory Visit: Payer: Self-pay

## 2024-05-14 ENCOUNTER — Ambulatory Visit (HOSPITAL_COMMUNITY): Payer: Self-pay | Admitting: Anesthesiology

## 2024-05-14 ENCOUNTER — Encounter (HOSPITAL_COMMUNITY): Payer: Self-pay | Admitting: Obstetrics & Gynecology

## 2024-05-14 DIAGNOSIS — D270 Benign neoplasm of right ovary: Secondary | ICD-10-CM | POA: Diagnosis not present

## 2024-05-14 DIAGNOSIS — R1031 Right lower quadrant pain: Secondary | ICD-10-CM | POA: Insufficient documentation

## 2024-05-14 HISTORY — PX: ROBOTIC ASSISTED LAPAROSCOPIC OVARIAN CYSTECTOMY: SHX6081

## 2024-05-14 LAB — ABO/RH: ABO/RH(D): O POS

## 2024-05-14 SURGERY — EXCISION, CYST, OVARY, ROBOT-ASSISTED, LAPAROSCOPIC
Anesthesia: General | Site: Abdomen | Laterality: Right

## 2024-05-14 MED ORDER — FENTANYL CITRATE (PF) 50 MCG/ML IJ SOSY
25.0000 ug | PREFILLED_SYRINGE | INTRAMUSCULAR | Status: DC | PRN
  Administered 2024-05-14: 17:00:00 50 ug via INTRAVENOUS
  Filled 2024-05-14: qty 1

## 2024-05-14 MED ORDER — GLYCOPYRROLATE PF 0.2 MG/ML IJ SOSY
PREFILLED_SYRINGE | INTRAMUSCULAR | Status: AC
  Filled 2024-05-14: qty 1

## 2024-05-14 MED ORDER — EPHEDRINE 5 MG/ML INJ
INTRAVENOUS | Status: AC
  Filled 2024-05-14: qty 20

## 2024-05-14 MED ORDER — ONDANSETRON HCL 4 MG/2ML IJ SOLN
INTRAMUSCULAR | Status: DC | PRN
  Administered 2024-05-14: 16:00:00 4 mg via INTRAVENOUS

## 2024-05-14 MED ORDER — LIDOCAINE 2% (20 MG/ML) 5 ML SYRINGE
INTRAMUSCULAR | Status: AC
  Filled 2024-05-14: qty 5

## 2024-05-14 MED ORDER — LACTATED RINGERS IV SOLN: INTRAVENOUS | Status: DC | PRN

## 2024-05-14 MED ORDER — ONDANSETRON 8 MG PO TBDP: 8.0000 mg | ORAL_TABLET | Freq: Three times a day (TID) | ORAL | 0 refills | Status: AC | PRN

## 2024-05-14 MED ORDER — KETOROLAC TROMETHAMINE 30 MG/ML IJ SOLN
30.0000 mg | INTRAMUSCULAR | Status: AC
  Administered 2024-05-14: 12:00:00 30 mg via INTRAVENOUS
  Filled 2024-05-14: qty 1

## 2024-05-14 MED ORDER — CEFAZOLIN SODIUM-DEXTROSE 2-4 GM/100ML-% IV SOLN
2.0000 g | INTRAVENOUS | Status: AC
  Administered 2024-05-14: 14:00:00 2 g via INTRAVENOUS
  Filled 2024-05-14: qty 100

## 2024-05-14 MED ORDER — OXYCODONE HCL 5 MG PO TABS: 5.0000 mg | ORAL_TABLET | Freq: Once | ORAL | Status: DC | PRN

## 2024-05-14 MED ORDER — FENTANYL CITRATE (PF) 100 MCG/2ML IJ SOLN
INTRAMUSCULAR | Status: AC
  Filled 2024-05-14: qty 2

## 2024-05-14 MED ORDER — BUPIVACAINE HCL (PF) 0.25 % IJ SOLN
INTRAMUSCULAR | Status: AC
  Filled 2024-05-14: qty 60

## 2024-05-14 MED ORDER — BUPIVACAINE HCL 0.25 % IJ SOLN
INTRAMUSCULAR | Status: DC | PRN
  Administered 2024-05-14: 16:00:00 40 mL

## 2024-05-14 MED ORDER — PROPOFOL 10 MG/ML IV BOLUS
INTRAVENOUS | Status: DC | PRN
  Administered 2024-05-14: 14:00:00 160 mg via INTRAVENOUS

## 2024-05-14 MED ORDER — HYDROMORPHONE HCL 1 MG/ML IJ SOLN
INTRAMUSCULAR | Status: AC
  Filled 2024-05-14: qty 0.5

## 2024-05-14 MED ORDER — SODIUM CHLORIDE 0.9 % IR SOLN
Status: DC | PRN
  Administered 2024-05-14: 15:00:00 3000 mL

## 2024-05-14 MED ORDER — SUGAMMADEX SODIUM 200 MG/2ML IV SOLN
INTRAVENOUS | Status: AC
  Filled 2024-05-14: qty 2

## 2024-05-14 MED ORDER — ONDANSETRON HCL 4 MG/2ML IJ SOLN
4.0000 mg | Freq: Once | INTRAMUSCULAR | Status: AC | PRN
  Administered 2024-05-14: 17:00:00 4 mg via INTRAVENOUS
  Filled 2024-05-14: qty 2

## 2024-05-14 MED ORDER — SUGAMMADEX SODIUM 200 MG/2ML IV SOLN
INTRAVENOUS | Status: DC | PRN
  Administered 2024-05-14: 16:00:00 200 mg via INTRAVENOUS

## 2024-05-14 MED ORDER — OXYCODONE-ACETAMINOPHEN 7.5-325 MG PO TABS: 1.0000 | ORAL_TABLET | Freq: Four times a day (QID) | ORAL | 0 refills | Status: AC | PRN

## 2024-05-14 MED ORDER — ROCURONIUM BROMIDE 10 MG/ML (PF) SYRINGE
PREFILLED_SYRINGE | INTRAVENOUS | Status: AC
  Filled 2024-05-14: qty 10

## 2024-05-14 MED ORDER — KETOROLAC TROMETHAMINE 10 MG PO TABS: 10.0000 mg | ORAL_TABLET | Freq: Three times a day (TID) | ORAL | 0 refills | Status: AC | PRN

## 2024-05-14 MED ORDER — DEXAMETHASONE SOD PHOSPHATE PF 10 MG/ML IJ SOLN
INTRAMUSCULAR | Status: DC | PRN
  Administered 2024-05-14: 15:00:00 10 mg via INTRAVENOUS

## 2024-05-14 MED ORDER — HYDRALAZINE HCL 20 MG/ML IJ SOLN
INTRAMUSCULAR | Status: AC
  Filled 2024-05-14: qty 2

## 2024-05-14 MED ORDER — ONDANSETRON HCL 4 MG/2ML IJ SOLN
INTRAMUSCULAR | Status: AC
  Filled 2024-05-14: qty 2

## 2024-05-14 MED ORDER — POVIDONE-IODINE 10 % EX SWAB
2.0000 | Freq: Once | CUTANEOUS | Status: AC
  Administered 2024-05-14: 12:00:00 2 via TOPICAL

## 2024-05-14 MED ORDER — HYDROMORPHONE HCL 1 MG/ML IJ SOLN
INTRAMUSCULAR | Status: DC | PRN
  Administered 2024-05-14 (×2): .5 mg via INTRAVENOUS

## 2024-05-14 MED ORDER — MIDAZOLAM HCL 2 MG/2ML IJ SOLN
INTRAMUSCULAR | Status: AC
  Filled 2024-05-14: qty 2

## 2024-05-14 MED ORDER — HYDRALAZINE HCL 20 MG/ML IJ SOLN
INTRAMUSCULAR | Status: DC | PRN
  Administered 2024-05-14: 16:00:00 5 mg via INTRAVENOUS

## 2024-05-14 MED ORDER — PROPOFOL 10 MG/ML IV BOLUS
INTRAVENOUS | Status: AC
  Filled 2024-05-14: qty 20

## 2024-05-14 MED ORDER — ESMOLOL HCL 100 MG/10ML IV SOLN
INTRAVENOUS | Status: DC | PRN
  Administered 2024-05-14: 16:00:00 10 mg via INTRAVENOUS

## 2024-05-14 MED ORDER — DEXMEDETOMIDINE HCL IN NACL 80 MCG/20ML IV SOLN
INTRAVENOUS | Status: AC
  Filled 2024-05-14: qty 20

## 2024-05-14 MED ORDER — HEMOSTATIC AGENTS (NO CHARGE) OPTIME
TOPICAL | Status: DC | PRN
  Administered 2024-05-14: 16:00:00 1 via TOPICAL

## 2024-05-14 MED ORDER — CHLORHEXIDINE GLUCONATE 0.12 % MT SOLN
OROMUCOSAL | Status: AC
  Filled 2024-05-14: qty 15

## 2024-05-14 MED ORDER — OXYCODONE HCL 5 MG/5ML PO SOLN: 5.0000 mg | Freq: Once | ORAL | Status: DC | PRN

## 2024-05-14 MED ORDER — 0.9 % SODIUM CHLORIDE (POUR BTL) OPTIME
TOPICAL | Status: DC | PRN
  Administered 2024-05-14: 15:00:00 1000 mL

## 2024-05-14 MED ORDER — DEXMEDETOMIDINE HCL IN NACL 80 MCG/20ML IV SOLN
INTRAVENOUS | Status: DC | PRN
  Administered 2024-05-14: 15:00:00 8 ug via INTRAVENOUS

## 2024-05-14 MED ADMIN — Rocuronium Bromide IV Soln Pref Syr 100 MG/10ML (10 MG/ML): 20 mg | INTRAVENOUS | @ 15:00:00 | NDC 99999070048

## 2024-05-14 MED ADMIN — Fentanyl Citrate Preservative Free (PF) Inj 250 MCG/5ML: 100 ug | INTRAVENOUS | @ 14:00:00 | NDC 72572017125

## 2024-05-14 MED ADMIN — Fentanyl Citrate Preservative Free (PF) Inj 250 MCG/5ML: 50 ug | INTRAVENOUS | @ 15:00:00 | NDC 72572017001

## 2024-05-14 MED ADMIN — Midazolam HCl Inj PF 2 MG/2ML (Base Equivalent): 2 mg | INTRAVENOUS | @ 14:00:00 | NDC 00409000125

## 2024-05-14 MED ADMIN — Glycopyrrolate Inj PF Soln Prefilled Syringe 0.2 MG/ML: .2 mg | INTRAVENOUS | @ 14:00:00 | NDC 76045020310

## 2024-05-14 MED ADMIN — Lidocaine HCl Local Soln Prefilled Syringe 100 MG/5ML (2%): 100 mg | INTRAVENOUS | @ 14:00:00 | NDC 70004072309

## 2024-05-14 MED ADMIN — Rocuronium Bromide IV Soln Pref Syr 100 MG/10ML (10 MG/ML): 80 mg | INTRAVENOUS | @ 14:00:00 | NDC 99999070048

## 2024-05-14 MED FILL — Hydromorphone HCl Inj 1 MG/ML: INTRAMUSCULAR | Qty: 0.5 | Status: AC

## 2024-05-14 SURGICAL SUPPLY — 47 items
APPLICATOR COTTON TIP 6 STRL (MISCELLANEOUS) ×1 IMPLANT
CANNULA REDUCER 12-8 DVNC XI (CANNULA) ×1 IMPLANT
CAUTERY HOOK MNPLR 1.6 DVNC XI (INSTRUMENTS) ×1 IMPLANT
COVER LIGHT HANDLE STERIS (MISCELLANEOUS) ×2 IMPLANT
COVER MAYO STAND XLG (MISCELLANEOUS) ×1 IMPLANT
DERMABOND ADVANCED .7 DNX12 (GAUZE/BANDAGES/DRESSINGS) ×1 IMPLANT
DRAPE ARM DVNC X/XI (DISPOSABLE) ×3 IMPLANT
DRAPE COLUMN DVNC XI (DISPOSABLE) ×1 IMPLANT
DRIVER NDL MEGA SUTCUT DVNCXI (INSTRUMENTS) IMPLANT
DRIVER NDLE MEGA SUTCUT DVNCXI (INSTRUMENTS) IMPLANT
ELECT PENCIL ROCKER SW 15FT (MISCELLANEOUS) ×1 IMPLANT
ELECTRODE REM PT RTRN 9FT ADLT (ELECTROSURGICAL) ×1 IMPLANT
FORCEPS PROGRASP DVNC XI (FORCEP) IMPLANT
GLOVE BIOGEL PI IND STRL 7.0 (GLOVE) ×3 IMPLANT
GLOVE BIOGEL PI IND STRL 8 (GLOVE) ×2 IMPLANT
GLOVE ECLIPSE 8.0 STRL XLNG CF (GLOVE) ×2 IMPLANT
GOWN STRL REUS W/TWL LRG LVL3 (GOWN DISPOSABLE) ×1 IMPLANT
GOWN STRL REUS W/TWL XL LVL3 (GOWN DISPOSABLE) ×2 IMPLANT
IRRIGATION STRYKERFLOW (MISCELLANEOUS) IMPLANT
KIT PINK PAD W/HEAD ARM REST (MISCELLANEOUS) ×1 IMPLANT
KIT TURNOVER KIT A (KITS) ×1 IMPLANT
MANIFOLD NEPTUNE II (INSTRUMENTS) ×1 IMPLANT
NDL HYPO 21X1.5 SAFETY (NEEDLE) ×1 IMPLANT
NDL INSUFFLATION 14GA 120MM (NEEDLE) ×1 IMPLANT
NEEDLE HYPO 21X1.5 SAFETY (NEEDLE) ×1 IMPLANT
NEEDLE INSUFFLATION 14GA 120MM (NEEDLE) ×1 IMPLANT
OBTURATOR OPTICALSTD 8 DVNC (TROCAR) ×1 IMPLANT
PACK LAP CHOLE LZT030E (CUSTOM PROCEDURE TRAY) ×1 IMPLANT
POSITIONER HEAD 8X9X4 ADT (SOFTGOODS) ×1 IMPLANT
POWDER SURGICEL 3.0 GRAM (HEMOSTASIS) IMPLANT
SEAL UNIV 5-12 XI (MISCELLANEOUS) ×3 IMPLANT
SEALER VESSEL EXT DVNC XI (MISCELLANEOUS) ×1 IMPLANT
SET BASIN LINEN APH (SET/KITS/TRAYS/PACK) ×1 IMPLANT
SET TUBE DA VINCI INSUFFLATOR (TUBING) IMPLANT
SOL .9 NS 3000ML IRR UROMATIC (IV SOLUTION) ×1 IMPLANT
SUT VICRYL 0 UR6 27IN ABS (SUTURE) ×1 IMPLANT
SUT VICRYL AB 3-0 FS1 BRD 27IN (SUTURE) ×2 IMPLANT
SUTURE STRATFX 0 PDS+ CT-2 23 (SUTURE) IMPLANT
SYR 20ML LL LF (SYRINGE) ×1 IMPLANT
SYR CONTROL 10ML LL (SYRINGE) ×1 IMPLANT
SYSTEM BAG RETRIEVAL 10MM (BASKET) ×1 IMPLANT
TAPE TRANSPORE STRL 2 31045 (GAUZE/BANDAGES/DRESSINGS) ×1 IMPLANT
TIP ENDOSCOPIC SURGICEL (TIP) IMPLANT
TRAY FOL W/BAG SLVR 16FR STRL (SET/KITS/TRAYS/PACK) ×1 IMPLANT
TROCAR KII 8X100ML NONTHREADED (TROCAR) ×1 IMPLANT
TUBE CONNECTING 12X1/4 (SUCTIONS) IMPLANT
WATER STERILE IRR 500ML POUR (IV SOLUTION) ×1 IMPLANT

## 2024-05-14 NOTE — Op Note (Signed)
 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 Right lower quadrant pain  Postoperative diagnosis: Same as above with no evidence of benign cystic teratoma on the left  Procedure: Robot-assisted laparoscopic right ovarian cystectomy with closure of right ovary  Tap block for postoperative pain management placed by me  Surgeon: Vonn VEAR Inch, MD  Anesthesia: General endotracheal  Findings: Per preoperative scans there was a 10.1 cm right ovarian benign cystic teratoma present There was not one observed on the left There was some filmy adhesions in the posterior cul-de-sac which were taken down without difficulty The tubes and ovaries otherwise were normal the peritoneal cavity was normal  Description of operation: Patient was taken to the operating room placed in the supine position where she underwent general tracheal anesthesia She was prepped and draped in usual sterile fashion and a Foley catheter was placed And incision was made just above the umbilicus and dissection was performed to the rectus fascia The rectus fascia was grasped with a Kocher clamp A Veress needle was used and the peritoneal cavity was entered without difficulty Saline drop test confirmed The peritoneal cavity was insufflated An 8 mm video laparoscope trocar was placed into the peritoneal cavity under direct visualization without difficulty The peritoneal cavity was confirmed 3 additional incisions were made, 1 larger 1 left lateral, clavicular line and right lateral 8 mm trocars were placed under direct visualization right lateral and left midclavicular A 12 mm trocars placed in the left lateral  Robotic instruments used: ProGrasp Monopolar hook Mega cut needle driver  I dissected the right ovary off of the posterior cul-de-sac and left pelvic sidewall I elevated the right ovary and found an area to begin dissection of the ovarian cortex off of the benign cystic teratoma The  monopolar hook was used and an incision was made in the ovarian cortex I used the ProGrasp to grab the ovarian cortex I used the monopolar hook bluntly to dissect the benign cystic teratoma off of the ovarian cortex This was carried out the entire circumferential incision of the ovary where the cyst was located Using combination of traction on the cyst with the monopolar hook laying flat and using the ProGrasp to pull the ovarian cortex I was able to remove the entire ovarian cyst intact Occasionally I would use energy and the monopolar hook for vascular areas  I laid the cyst to the side  I then closed the ovary at the cut edge and obliterating the dead space by attaching to the opposite back wall of the ovary and then the cut edge again with good resulting anatomy and hemostasis STRATAFIX suture was used Surgicel was placed for additional hemostasis although it was hemostatic at the time of closure  The lower abdomen pelvis were then irrigated vigorously Was good hemostasis I placed the Endo Catch bag and the left lower quadrant incision  I then placed a tap block for postoperative pain management in the usual fashion under direct visualization using Doyles technique  I enlarged the left lateral incision and cut the fascia with scissors under direct visualization I was able to pull the bag intact with the dermoid intact through the abdominal incision  I then closed the rectus fascia with 0 Vicryl and did a subcutaneous closure as well I then closed the skin incision left laterally in a subcuticular fashion and then used Dermabond  The other 3 incisions were closed in a subcuticular fashion without difficulty and Dermabond was placed  The patient was taken  to the recovery room in good stable condition all counts correct She received 2 g of Ancef  and 30 mg of Toradol  preoperatively  Blood loss was about 100 cc  Vonn VEAR Inch, MD 05/14/2024 4:33 PM

## 2024-05-14 NOTE — H&P (Signed)
 Preoperative History and Physical  Christy Goodman is a 28 y.o. G0P0000 with Patient's last menstrual period was 05/01/2024 (exact date). admitted for a RA removal of right ovarian dermoid.  10.1 cm on scan with RLQ pain  PMH:    Past Medical History:  Diagnosis Date   Allergies    Asthma    Gonorrhea 06/06/2016   Gonorrhea 06/06/2016   H/O seasonal allergies    Hypertension     PSH:    History reviewed. No pertinent surgical history.  POb/GynH:      OB History     Gravida  0   Para  0   Term  0   Preterm  0   AB  0   Living  0      SAB  0   IAB  0   Ectopic  0   Multiple  0   Live Births  0           SH:  Social History[1]  FH:    Family History  Problem Relation Age of Onset   Cancer Paternal Grandmother    Diabetes Paternal Grandmother    Diabetes Paternal Aunt      Allergies: Allergies[2]  Medications:      Current Medications[3]  Review of Systems:   Review of Systems  Constitutional: Negative for fever, chills, weight loss, malaise/fatigue and diaphoresis.  HENT: Negative for hearing loss, ear pain, nosebleeds, congestion, sore throat, neck pain, tinnitus and ear discharge.   Eyes: Negative for blurred vision, double vision, photophobia, pain, discharge and redness.  Respiratory: Negative for cough, hemoptysis, sputum production, shortness of breath, wheezing and stridor.   Cardiovascular: Negative for chest pain, palpitations, orthopnea, claudication, leg swelling and PND.  Gastrointestinal: Positive for abdominal pain. Negative for heartburn, nausea, vomiting, diarrhea, constipation, blood in stool and melena.  Genitourinary: Negative for dysuria, urgency, frequency, hematuria and flank pain.  Musculoskeletal: Negative for myalgias, back pain, joint pain and falls.  Skin: Negative for itching and rash.  Neurological: Negative for dizziness, tingling, tremors, sensory change, speech change, focal weakness, seizures, loss of  consciousness, weakness and headaches.  Endo/Heme/Allergies: Negative for environmental allergies and polydipsia. Does not bruise/bleed easily.  Psychiatric/Behavioral: Negative for depression, suicidal ideas, hallucinations, memory loss and substance abuse. The patient is not nervous/anxious and does not have insomnia.      PHYSICAL EXAM:  Blood pressure 135/84, pulse 66, temperature 98.5 F (36.9 C), temperature source Oral, resp. rate 19, height 5' 3 (1.6 m), weight 96.6 kg, last menstrual period 05/01/2024, SpO2 100%.    Vitals reviewed. Constitutional: She is oriented to person, place, and time. She appears well-developed and well-nourished.  HENT:  Head: Normocephalic and atraumatic.  Right Ear: External ear normal.  Left Ear: External ear normal.  Nose: Nose normal.  Mouth/Throat: Oropharynx is clear and moist.  Eyes: Conjunctivae and EOM are normal. Pupils are equal, round, and reactive to light. Right eye exhibits no discharge. Left eye exhibits no discharge. No scleral icterus.  Neck: Normal range of motion. Neck supple. No tracheal deviation present. No thyromegaly present.  Cardiovascular: Normal rate, regular rhythm, normal heart sounds and intact distal pulses.  Exam reveals no gallop and no friction rub.   No murmur heard. Respiratory: Effort normal and breath sounds normal. No respiratory distress. She has no wheezes. She has no rales. She exhibits no tenderness.  GI: Soft. Bowel sounds are normal. She exhibits no distension and no mass. There is tenderness. There is  no rebound and no guarding.  Genitourinary:       Vulva is normal without lesions Vagina is pink moist without discharge Cervix normal in appearance and pap is normal Uterus is normal size, contour, position, consistency, mobility, non-tender Adnexa is per sonogram Musculoskeletal: Normal range of motion. She exhibits no edema and no tenderness.  Neurological: She is alert and oriented to person, place,  and time. She has normal reflexes. She displays normal reflexes. No cranial nerve deficit. She exhibits normal muscle tone. Coordination normal.  Skin: Skin is warm and dry. No rash noted. No erythema. No pallor.  Psychiatric: She has a normal mood and affect. Her behavior is normal. Judgment and thought content normal.    Labs: Results for orders placed or performed during the hospital encounter of 05/14/24 (from the past 2 weeks)  ABO/Rh   Collection Time: 05/14/24  7:05 AM  Result Value Ref Range   ABO/RH(D)      O POS Performed at [facility], [address], [place], [place] [address]   Results for orders placed or performed during the hospital encounter of 05/12/24 (from the past 2 weeks)  CBC   Collection Time: 05/12/24  2:28 PM  Result Value Ref Range   WBC 9.6 4.0 - 10.5 K/uL   RBC 4.07 3.87 - 5.11 MIL/uL   Hemoglobin 12.2 12.0 - 15.0 g/dL   HCT 62.8 63.9 - 53.9 %   MCV 91.2 80.0 - 100.0 fL   MCH 30.0 26.0 - 34.0 pg   MCHC 32.9 30.0 - 36.0 g/dL   RDW 86.0 88.4 - 84.4 %   Platelets 294 150 - 400 K/uL   nRBC 0.0 0.0 - 0.2 %  Comprehensive metabolic panel   Collection Time: 05/12/24  2:28 PM  Result Value Ref Range   Sodium 138 135 - 145 mmol/L   Potassium 4.2 3.5 - 5.1 mmol/L   Chloride 103 98 - 111 mmol/L   CO2 23 22 - 32 mmol/L   Glucose, Bld 90 70 - 99 mg/dL   BUN 9 6 - 20 mg/dL   Creatinine, Ser 9.23 0.44 - 1.00 mg/dL   Calcium 9.3 8.9 - 89.6 mg/dL   Total Protein 7.2 6.5 - 8.1 g/dL   Albumin 4.4 3.5 - 5.0 g/dL   AST 19 15 - 41 U/L   ALT 14 0 - 44 U/L   Alkaline Phosphatase 55 38 - 126 U/L   Total Bilirubin 0.3 0.0 - 1.2 mg/dL   GFR, Estimated >39 >39 mL/min   Anion gap 11 5 - 15  Rapid HIV screen (HIV 1/2 Ab+Ag)   Collection Time: 05/12/24  2:28 PM  Result Value Ref Range   HIV-1 P24 Antigen - HIV24 NON REACTIVE NON REACTIVE   HIV 1/2 Antibodies NON REACTIVE NON REACTIVE   Interpretation (HIV Ag Ab)      A non reactive test result means that HIV 1 or  HIV 2 antibodies and HIV 1 p24 antigen were not detected in the specimen.  Urinalysis, Routine w reflex microscopic -Urine, Clean Catch   Collection Time: 05/12/24  2:28 PM  Result Value Ref Range   Color, Urine YELLOW YELLOW   APPearance CLEAR CLEAR   Specific Gravity, Urine 1.015 1.005 - 1.030   pH 7.0 5.0 - 8.0   Glucose, UA NEGATIVE NEGATIVE mg/dL   Hgb urine dipstick MODERATE (A) NEGATIVE   Bilirubin Urine NEGATIVE NEGATIVE   Ketones, ur NEGATIVE NEGATIVE mg/dL   Protein, ur NEGATIVE NEGATIVE  mg/dL   Nitrite NEGATIVE NEGATIVE   Leukocytes,Ua NEGATIVE NEGATIVE   RBC / HPF 6-10 0 - 5 RBC/hpf   WBC, UA 0-5 0 - 5 WBC/hpf   Bacteria, UA NONE SEEN NONE SEEN   Squamous Epithelial / HPF 0-5 0 - 5 /HPF   Mucus PRESENT   Pregnancy, urine   Collection Time: 05/12/24  2:28 PM  Result Value Ref Range   Preg Test, Ur NEGATIVE NEGATIVE  Type and screen   Collection Time: 05/12/24  2:28 PM  Result Value Ref Range   ABO/RH(D) O POS    Antibody Screen NEG    Sample Expiration 05/26/2024,2359    Extend sample reason      NO TRANSFUSIONS OR PREGNANCY IN THE PAST 3 MONTHS Performed at [facility], [address], [place], [place] [address]   Results for orders placed or performed during the hospital encounter of 05/04/24 (from the past 2 weeks)  Urinalysis, Routine w reflex microscopic -Urine, Clean Catch   Collection Time: 05/04/24  9:28 AM  Result Value Ref Range   Color, Urine STRAW (A) YELLOW   APPearance CLEAR CLEAR   Specific Gravity, Urine 1.016 1.005 - 1.030   pH 6.0 5.0 - 8.0   Glucose, UA NEGATIVE NEGATIVE mg/dL   Hgb urine dipstick MODERATE (A) NEGATIVE   Bilirubin Urine NEGATIVE NEGATIVE   Ketones, ur NEGATIVE NEGATIVE mg/dL   Protein, ur NEGATIVE NEGATIVE mg/dL   Nitrite NEGATIVE NEGATIVE   Leukocytes,Ua NEGATIVE NEGATIVE   RBC / HPF 11-20 0 - 5 RBC/hpf   WBC, UA 0-5 0 - 5 WBC/hpf   Bacteria, UA NONE SEEN NONE SEEN   Squamous Epithelial / HPF 0-5 0 - 5 /HPF   Mucus  PRESENT   POC urine preg, ED   Collection Time: 05/04/24  9:34 AM  Result Value Ref Range   Preg Test, Ur NEGATIVE NEGATIVE  Lipase, blood   Collection Time: 05/04/24  9:55 AM  Result Value Ref Range   Lipase 25 11 - 51 U/L  Comprehensive metabolic panel   Collection Time: 05/04/24  9:55 AM  Result Value Ref Range   Sodium 140 135 - 145 mmol/L   Potassium 4.1 3.5 - 5.1 mmol/L   Chloride 104 98 - 111 mmol/L   CO2 28 22 - 32 mmol/L   Glucose, Bld 98 70 - 99 mg/dL   BUN 12 6 - 20 mg/dL   Creatinine, Ser 9.12 0.44 - 1.00 mg/dL   Calcium 8.8 (L) 8.9 - 10.3 mg/dL   Total Protein 7.1 6.5 - 8.1 g/dL   Albumin 4.4 3.5 - 5.0 g/dL   AST 19 15 - 41 U/L   ALT 15 0 - 44 U/L   Alkaline Phosphatase 55 38 - 126 U/L   Total Bilirubin 0.3 0.0 - 1.2 mg/dL   GFR, Estimated >39 >39 mL/min   Anion gap 8 5 - 15  CBC   Collection Time: 05/04/24  9:55 AM  Result Value Ref Range   WBC 10.1 4.0 - 10.5 K/uL   RBC 4.25 3.87 - 5.11 MIL/uL   Hemoglobin 12.6 12.0 - 15.0 g/dL   HCT 62.1 63.9 - 53.9 %   MCV 88.9 80.0 - 100.0 fL   MCH 29.6 26.0 - 34.0 pg   MCHC 33.3 30.0 - 36.0 g/dL   RDW 86.2 88.4 - 84.4 %   Platelets 322 150 - 400 K/uL   nRBC 0.0 0.0 - 0.2 %    EKG: Orders placed  or performed during the hospital encounter of 05/04/24   ED EKG   ED EKG    Imaging Studies: US  PELVIC COMPLETE W TRANSVAGINAL AND TORSION R/O Result Date: 05/04/2024 CLINICAL DATA:  Right-sided pelvic mass on CT, right lower quadrant pain for 2 days EXAM: TRANSABDOMINAL AND TRANSVAGINAL ULTRASOUND OF PELVIS DOPPLER ULTRASOUND OF OVARIES TECHNIQUE: Both transabdominal and transvaginal ultrasound examinations of the pelvis were performed. Transabdominal technique was performed for global imaging of the pelvis including uterus, ovaries, adnexal regions, and pelvic cul-de-sac. It was necessary to proceed with endovaginal exam following the transabdominal exam to visualize the adnexal structures. Color and duplex Doppler  ultrasound was utilized to evaluate blood flow to the ovaries. COMPARISON:  05/04/2024 FINDINGS: Uterus Measurements: 10.1 x 5.0 x 6.8 cm = volume: 179.8 mL. No fibroids or other mass visualized. Endometrium Thickness: 13 mm.  No focal abnormality visualized. Right ovary Measurements: 5.3 x 4.9 x 7.9 cm = volume:  106.4 mL. The entirety of the right ovary is replaced by a heterogeneous echogenic mass corresponding to the fat containing ovarian dermoid on recent CT. Doppler: There is normal vascularity on color doppler examination. Spectral doppler arterial and venous waveforms are normal. Left ovary Measurements: 3.8 x 2.7 x 6.2 cm = volume: 33.0 mL. Normal appearance/no adnexal mass. Doppler: There is normal vascularity on color doppler examination. Spectral doppler arterial and venous waveforms are normal. Other findings No abnormal free fluid. IMPRESSION: 1. 7.9 cm right ovarian mass compatible with dermoid seen on preceding CT. 2. No evidence of ovarian torsion.  No acute pelvic findings. Electronically Signed   By: Ozell Daring M.D.   On: 05/04/2024 13:10   CT ABDOMEN PELVIS W CONTRAST Result Date: 05/04/2024 EXAM: CT ABDOMEN AND PELVIS WITH CONTRAST 05/04/2024 10:51:39 AM TECHNIQUE: CT of the abdomen and pelvis was performed with the administration of intravenous contrast. 100 cc Omnipaque  300 was administered. Multiplanar reformatted images are provided for review. Automated exposure control, iterative reconstruction, and/or weight-based adjustment of the mA/kV was utilized to reduce the radiation dose to as low as reasonably achievable. COMPARISON: 06/26/2027 CLINICAL HISTORY: RLQ abdominal pain FINDINGS: LOWER CHEST: No acute abnormality. LIVER: The liver is unremarkable. GALLBLADDER AND BILE DUCTS: Gallbladder is unremarkable. No biliary ductal dilatation. SPLEEN: No acute abnormality. PANCREAS: No acute abnormality. ADRENAL GLANDS: No acute abnormality. KIDNEYS, URETERS AND BLADDER: No stones in the  kidneys or ureters. No hydronephrosis. No perinephric or periureteral stranding. Urinary bladder is unremarkable. GI AND BOWEL: The appendix is not well visualized. No edema or inflammation in the region of the cecal tip to suggest appendicitis. Stomach demonstrates no acute abnormality. There is no bowel obstruction. PERITONEUM AND RETROPERITONEUM: No ascites. No free air. VASCULATURE: Aorta is normal in caliber. LYMPH NODES: No lymphadenopathy. REPRODUCTIVE ORGANS: 5.9 x 3.9 cm fat-containing right adnexal mass was 5.4 x 3.5 cm on the previous study, compatible with ovarian dermoid. No left adnexal mass. Trace fluid in the endometrial canal, likely physiologic. BONES AND SOFT TISSUES: No acute osseous abnormality. No focal soft tissue abnormality. IMPRESSION: 1. No acute findings. 2. Minimal interval enlargement of right ovarian dermoid measuring 5.9 x 3.9 cm, previously 5.4 x 3.5 cm. No left adnexal mass. 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 (5.7 cm in 2019) RLQ pain  Plan: RA laparoscopic right ovarian cystectomy  Vonn VEAR Inch 05/14/2024 1:58 PM           [  1]  Social History Tobacco Use   Smoking status: Former    Current packs/day: 0.25    Average packs/day: 0.3 packs/day for 1 year (0.3 ttl pk-yrs)    Types: Cigarettes, Cigars    Passive exposure: Past   Smokeless tobacco: Never  Vaping Use   Vaping status: Never Used  Substance Use Topics   Alcohol use: Yes    Comment: occ; tequila   Drug use: Yes    Types: Marijuana    Comment: 3 joints per day  [2] No Known Allergies [3]  Current Facility-Administered Medications:    ceFAZolin  (ANCEF ) IVPB 2g/100 mL premix, 2 g, Intravenous, On Call to OR, Jayne Vonn DEL, MD   chlorhexidine  (PERIDEX ) 0.12 % solution, , , ,

## 2024-05-14 NOTE — Anesthesia Procedure Notes (Signed)
 Procedure Name: Intubation Date/Time: 05/14/2024 2:20 PM  Performed by: Lynnetta Darrel Lebron Mickey., CRNAPre-anesthesia Checklist: Patient identified, Emergency Drugs available, Suction available and Patient being monitored Patient Re-evaluated:Patient Re-evaluated prior to induction Oxygen Delivery Method: Circle system utilized Preoxygenation: Pre-oxygenation with 100% oxygen Induction Type: IV induction Ventilation: Mask ventilation without difficulty Laryngoscope Size: Mac and 3 Grade View: Grade II Tube type: Oral Tube size: 7.0 mm Number of attempts: 1 Airway Equipment and Method: Stylet and Oral airway Placement Confirmation: ETT inserted through vocal cords under direct vision, positive ETCO2 and breath sounds checked- equal and bilateral Secured at: 21 cm Tube secured with: Tape Dental Injury: Teeth and Oropharynx as per pre-operative assessment

## 2024-05-14 NOTE — Transfer of Care (Signed)
 Immediate Anesthesia Transfer of Care Note  Patient: Christy Goodman  Procedure(s) Performed: EXCISION, CYST, OVARY, ROBOT-ASSISTED, LAPAROSCOPIC (Right: Abdomen)  Patient Location: PACU  Anesthesia Type:General  Level of Consciousness: drowsy  Airway & Oxygen Therapy: Patient Spontanous Breathing and Patient connected to face mask oxygen  Post-op Assessment: Report given to RN and Post -op Vital signs reviewed and stable  Post vital signs: Reviewed and stable  Last Vitals:  Vitals Value Taken Time  BP 145/92 05/14/24 16:30  Temp 97.9   Pulse 107 05/14/24 16:30  Resp 21 05/14/24 16:31  SpO2 93 % 05/14/24 16:30  Vitals shown include unfiled device data.  Last Pain:  Vitals:   05/14/24 1207  TempSrc:   PainSc: 0-No pain      Patients Stated Pain Goal: 4 (05/14/24 1144)  Complications: No notable events documented.

## 2024-05-14 NOTE — Anesthesia Preprocedure Evaluation (Addendum)
 Anesthesia Evaluation  Patient identified by MRN, date of birth, ID band Patient awake    Reviewed: Allergy & Precautions, NPO status , Patient's Chart, lab work & pertinent test results  Airway Mallampati: II  TM Distance: >3 FB Neck ROM: Full    Dental no notable dental hx.    Pulmonary asthma , former smoker   Pulmonary exam normal        Cardiovascular hypertension, Pt. on medications Normal cardiovascular exam     Neuro/Psych negative neurological ROS  negative psych ROS   GI/Hepatic negative GI ROS, Neg liver ROS,,,  Endo/Other  negative endocrine ROS    Renal/GU negative Renal ROS  negative genitourinary   Musculoskeletal   Abdominal  (+) + obese  Peds  Hematology   Anesthesia Other Findings   Reproductive/Obstetrics                              Anesthesia Physical Anesthesia Plan  ASA: 2  Anesthesia Plan: General   Post-op Pain Management:    Induction: Intravenous  PONV Risk Score and Plan: Ondansetron , Dexamethasone  and Midazolam   Airway Management Planned: Oral ETT  Additional Equipment:   Intra-op Plan:   Post-operative Plan: Extubation in OR  Informed Consent: I have reviewed the patients History and Physical, chart, labs and discussed the procedure including the risks, benefits and alternatives for the proposed anesthesia with the patient or authorized representative who has indicated his/her understanding and acceptance.     Dental advisory given  Plan Discussed with: Anesthesiologist  Anesthesia Plan Comments:          Anesthesia Quick Evaluation

## 2024-05-15 ENCOUNTER — Encounter (HOSPITAL_COMMUNITY): Payer: Self-pay | Admitting: Obstetrics & Gynecology

## 2024-05-15 ENCOUNTER — Ambulatory Visit: Admitting: Internal Medicine

## 2024-05-16 ENCOUNTER — Encounter: Payer: Self-pay | Admitting: Obstetrics & Gynecology

## 2024-05-16 LAB — SURGICAL PATHOLOGY

## 2024-05-16 NOTE — Anesthesia Postprocedure Evaluation (Signed)
"   Anesthesia Post Note  Patient: Christy Goodman  Procedure(s) Performed: EXCISION, CYST, OVARY, ROBOT-ASSISTED, LAPAROSCOPIC (Right: Abdomen)  Patient location during evaluation: Phase II Anesthesia Type: General Level of consciousness: awake Pain management: pain level controlled Vital Signs Assessment: post-procedure vital signs reviewed and stable Respiratory status: spontaneous breathing and respiratory function stable Cardiovascular status: blood pressure returned to baseline and stable Postop Assessment: no headache and no apparent nausea or vomiting Anesthetic complications: no Comments: Late entry   No notable events documented.   Last Vitals:  Vitals:   05/14/24 1730 05/14/24 1733  BP: (!) 140/88 (!) 138/91  Pulse: 98 96  Resp: 15 16  Temp:  36.6 C  SpO2: 99% 100%    Last Pain:  Vitals:   05/15/24 1248  TempSrc:   PainSc: 2                  [name]      "

## 2024-06-03 ENCOUNTER — Encounter: Payer: Self-pay | Admitting: Obstetrics & Gynecology

## 2024-06-03 ENCOUNTER — Ambulatory Visit: Admitting: Obstetrics & Gynecology

## 2024-06-03 VITALS — BP 131/80 | HR 84 | Ht 63.0 in | Wt 213.0 lb

## 2024-06-03 DIAGNOSIS — Z48816 Encounter for surgical aftercare following surgery on the genitourinary system: Secondary | ICD-10-CM

## 2024-06-03 DIAGNOSIS — Z9889 Other specified postprocedural states: Secondary | ICD-10-CM

## 2024-06-03 DIAGNOSIS — N946 Dysmenorrhea, unspecified: Secondary | ICD-10-CM

## 2024-06-03 MED ORDER — NAPROXEN SODIUM 550 MG PO TABS: 550.0000 mg | ORAL_TABLET | Freq: Two times a day (BID) | ORAL | 3 refills | Status: AC

## 2024-06-03 NOTE — Progress Notes (Signed)
" °  HPI: Patient returns for routine postoperative follow-up having undergone     ICD-10-CM   1. Post-operative state: RA Right ovarian cystectomy: benign cystic teratoma  Z98.890     2. Dysmenorrhea  N94.6        The patient's immediate postoperative recovery has been unremarkable. Since hospital discharge the patient reports no problems.   Current Outpatient Medications: Multiple Vitamins-Minerals (ONE-A-DAY WOMENS PO), Take by mouth., Disp: , Rfl:  ketorolac  (TORADOL ) 10 MG tablet, Take 1 tablet (10 mg total) by mouth every 8 (eight) hours as needed. (Patient not taking: Reported on 06/03/2024), Disp: 15 tablet, Rfl: 0 lisinopril  (ZESTRIL ) 5 MG tablet, TAKE ONE TABLET (5MG  TOTAL) BY MOUTH DAILY (Patient taking differently: Take 5 mg by mouth every evening. TAKE ONE TABLET (5MG  TOTAL) BY MOUTH DAILY), Disp: 90 tablet, Rfl: 0 ondansetron  (ZOFRAN -ODT) 8 MG disintegrating tablet, Take 1 tablet (8 mg total) by mouth every 8 (eight) hours as needed for nausea or vomiting. (Patient not taking: Reported on 06/03/2024), Disp: 8 tablet, Rfl: 0 oxyCODONE -acetaminophen  (PERCOCET) 7.5-325 MG tablet, Take 1 tablet by mouth every 6 (six) hours as needed. (Patient not taking: Reported on 06/03/2024), Disp: 28 tablet, Rfl: 0  No current facility-administered medications for this visit.    Blood pressure 131/80, pulse 84, height 5' 3 (1.6 m), weight 213 lb (96.6 kg), last menstrual period 05/01/2024.  Physical Exam: 4 incisions all look good  Diagnostic Tests:   Pathology: benign  Impression + Management plan:   ICD-10-CM   1. Post-operative state: RA Right ovarian cystectomy: benign cystic teratoma  Z98.890     2. Dysmenorrhea  N94.6      Meds ordered this encounter  Medications   naproxen  sodium (ANAPROX  DS) 550 MG tablet    Sig: Take 1 tablet (550 mg total) by mouth 2 (two) times daily with a meal.    Dispense:  60 tablet    Refill:  3         Medications Prescribed this  encounter: No orders of the defined types were placed in this encounter.     Follow up: prn   Vonn VEAR Inch, MD Attending Physician for the Center for [facility] and [facility] 06/03/2024 10:05 AM     "

## 2024-06-05 ENCOUNTER — Other Ambulatory Visit: Payer: Self-pay | Admitting: Family Medicine

## 2024-06-05 DIAGNOSIS — I10 Essential (primary) hypertension: Secondary | ICD-10-CM

## 2024-06-09 ENCOUNTER — Encounter: Payer: Self-pay | Admitting: Obstetrics & Gynecology
